# Patient Record
Sex: Female | Born: 1990
Health system: Southern US, Community
[De-identification: ages and names within clinical notes are randomized; demographics above are authoritative.]

## PROBLEM LIST (undated history)

## (undated) ENCOUNTER — Emergency Department (HOSPITAL_COMMUNITY): Admission: EM | Payer: BLUE CROSS/BLUE SHIELD | Source: Home / Self Care

## (undated) DIAGNOSIS — F32A Depression, unspecified: Secondary | ICD-10-CM

## (undated) DIAGNOSIS — B379 Candidiasis, unspecified: Secondary | ICD-10-CM

## (undated) DIAGNOSIS — T7840XA Allergy, unspecified, initial encounter: Secondary | ICD-10-CM

## (undated) DIAGNOSIS — F329 Major depressive disorder, single episode, unspecified: Secondary | ICD-10-CM

## (undated) DIAGNOSIS — I341 Nonrheumatic mitral (valve) prolapse: Secondary | ICD-10-CM

## (undated) DIAGNOSIS — S42309A Unspecified fracture of shaft of humerus, unspecified arm, initial encounter for closed fracture: Secondary | ICD-10-CM

## (undated) DIAGNOSIS — F419 Anxiety disorder, unspecified: Secondary | ICD-10-CM

## (undated) HISTORY — PX: TONSILLECTOMY: SUR1361

## (undated) HISTORY — DX: Allergy, unspecified, initial encounter: T78.40XA

## (undated) HISTORY — DX: Major depressive disorder, single episode, unspecified: F32.9

## (undated) HISTORY — DX: Unspecified fracture of shaft of humerus, unspecified arm, initial encounter for closed fracture: S42.309A

## (undated) HISTORY — DX: Depression, unspecified: F32.A

## (undated) HISTORY — DX: Anxiety disorder, unspecified: F41.9

## (undated) HISTORY — DX: Candidiasis, unspecified: B37.9

## (undated) HISTORY — DX: Nonrheumatic mitral (valve) prolapse: I34.1

## (undated) HISTORY — PX: APPENDECTOMY: SHX54

## (undated) HISTORY — PX: OTHER SURGICAL HISTORY: SHX169

---
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
## 2011-08-22 ENCOUNTER — Ambulatory Visit (INDEPENDENT_AMBULATORY_CARE_PROVIDER_SITE_OTHER): Payer: BC Managed Care – PPO

## 2011-08-22 DIAGNOSIS — H6500 Acute serous otitis media, unspecified ear: Secondary | ICD-10-CM

## 2011-08-22 DIAGNOSIS — J069 Acute upper respiratory infection, unspecified: Secondary | ICD-10-CM

## 2012-02-16 ENCOUNTER — Ambulatory Visit (INDEPENDENT_AMBULATORY_CARE_PROVIDER_SITE_OTHER): Payer: BC Managed Care – PPO | Admitting: Emergency Medicine

## 2012-02-16 VITALS — BP 116/78 | HR 81 | Temp 97.8°F | Resp 16 | Ht 66.58 in | Wt 133.6 lb

## 2012-02-16 DIAGNOSIS — Z2089 Contact with and (suspected) exposure to other communicable diseases: Secondary | ICD-10-CM

## 2012-02-16 DIAGNOSIS — K121 Other forms of stomatitis: Secondary | ICD-10-CM

## 2012-02-16 DIAGNOSIS — R509 Fever, unspecified: Secondary | ICD-10-CM

## 2012-02-16 DIAGNOSIS — K1370 Unspecified lesions of oral mucosa: Secondary | ICD-10-CM

## 2012-02-16 DIAGNOSIS — Z202 Contact with and (suspected) exposure to infections with a predominantly sexual mode of transmission: Secondary | ICD-10-CM

## 2012-02-16 LAB — POCT CBC
Granulocyte percent: 67.7 %G (ref 37–80)
MCV: 96.6 fL (ref 80–97)
MID (cbc): 0.5 (ref 0–0.9)
MPV: 8.8 fL (ref 0–99.8)
POC Granulocyte: 5.4 (ref 2–6.9)
Platelet Count, POC: 309 10*3/uL (ref 142–424)
RBC: 4.25 M/uL (ref 4.04–5.48)

## 2012-02-16 NOTE — Progress Notes (Signed)
  Subjective:    Patient ID: Dana Perry, female    DOB: [birth year], 22 y.o.   MRN: [id number]  Mouth Lesions  The current episode started 3 to 5 days ago. The problem occurs rarely. The problem has been unchanged. The problem is mild. Nothing relieves the symptoms. Nothing aggravates the symptoms. Associated symptoms include a fever and mouth sores. Pertinent negatives include no decreased vision, no double vision, no eye itching, no photophobia, no abdominal pain, no congestion, no ear discharge, no ear pain, no headaches, no hearing loss, no rhinorrhea, no sore throat, no stridor, no swollen glands, no eye discharge, no eye pain and no eye redness.  Exposure to STD  The patient's pertinent negatives include no discharge, dyspareunia, dysuria, genital itching, genital lesions, genital rash or pelvic pain. This is a new problem. The current episode started in the past 7 days. The problem has been unchanged. Associate symptoms include a fever. Pertinent negatives include no abdominal pain, anorexia, diaphoresis, genital odor, rectal pain, sore throat or urinary frequency. She has tried nothing for the symptoms. Risk factors include multiple sexual partners.      Review of Systems  Constitutional: Positive for fever. Negative for diaphoresis.  HENT: Positive for mouth sores. Negative for hearing loss, ear pain, congestion, sore throat, rhinorrhea and ear discharge.   Eyes: Negative for double vision, photophobia, pain, discharge, redness and itching.  Respiratory: Negative for stridor.   Gastrointestinal: Negative for abdominal pain, rectal pain and anorexia.  Genitourinary: Negative for dysuria, frequency, pelvic pain and dyspareunia.  Neurological: Negative for headaches.       Objective:   Physical Exam  Constitutional: She is oriented to person, place, and time. She appears well-developed and well-nourished.  HENT:  Head: Normocephalic and atraumatic. No trismus in the jaw.  Right  Ear: External ear normal.  Left Ear: External ear normal.  Nose: Nose normal.  Mouth/Throat: She does not have dentures. Oral lesions present. Normal dentition. No dental abscesses, uvula swelling, lacerations or dental caries. No oropharyngeal exudate, posterior oropharyngeal edema, posterior oropharyngeal erythema or tonsillar abscesses.  Eyes: Conjunctivae are normal. Pupils are equal, round, and reactive to light.  Neck: Normal range of motion. Neck supple.  Cardiovascular: Normal rate and regular rhythm.   Pulmonary/Chest: Effort normal and breath sounds normal.  Abdominal: Soft. Bowel sounds are normal. She exhibits no distension and no mass. There is no tenderness. There is no rebound and no guarding.  Musculoskeletal: Normal range of motion.  Neurological: She is alert and oriented to person, place, and time.  Skin: Skin is warm and dry.  Psychiatric: She has a normal mood and affect. Her behavior is normal. Judgment and thought content normal.          Assessment & Plan:  Just spent a significant period of time in [place] and engaged in oral sex just prior to return.  Now has oral ulceration and has had a fever, not documented, as well over last week.  Concerned that she may have HIV  To further treat following labs.

## 2012-02-16 NOTE — Patient Instructions (Signed)
Safer Sex Your caregiver wants you to have this information about the infections that can be transmitted from sexual contact and how to prevent them. The idea behind safer sex is that you can be sexually active, and at the same time reduce the risk of giving or getting a sexually transmitted disease (STD). Every person should be aware of how to prevent him or herself and his or her sex partner from getting an STD. CAUSES OF STDS STDs are transmitted by sharing body fluids, which contain viruses and bacteria. The following fluids all transmit infections during sexual intercourse and sex acts:  Semen.   Saliva.   Urine.   Blood.   Vaginal mucus.  Examples of STDs include:  Chlamydia.   Gonorrhea.   Genital herpes.   Hepatitis B.   Human immunodeficiency virus or acquired immunodeficiency syndrome (HIV or AIDS).   Syphilis.   Trichomonas.   Pubic lice.   Human papillomavirus (HPV), which may include:   Genital warts.   Cervical dysplasia.   Cervical cancer (can develop with certain types of HPV).  SYMPTOMS  Sexual diseases often cause few or no symptoms until they are advanced, so a person can be infected and spread the infection without knowing it. Some STDs respond to treatment very well. Others, like HIV and herpes, cannot be cured, but are treated to reduce their effects. Specific symptoms include:  Abnormal vaginal discharge.   Irritation or itching in and around the vagina, and in the pubic hair.   Pain during sexual intercourse.   Bleeding during sexual intercourse.   Pelvic or abdominal pain.   Fever.   Growths in and around the vagina.   An ulcer in or around the vagina.   Swollen glands in the groin area.  DIAGNOSIS   Blood tests.   Pap test.   Culture test of abnormal vaginal discharge.   A test that applies a solution and examines the cervix with a lighted magnifying scope (colposcopy).   A test that examines the pelvis with a lighted  tube, through a small incision (laparoscopy).  TREATMENT  The treatment will depend on the cause of the STD.  Antibiotic treatment by injection, oral, creams, or suppositories in the vagina.   Over-the-counter medicated shampoo, to get rid of pubic lice.   Removing or treating growths with medicine, freezing, burning (electrocautery), or surgery.   Surgery treatment for HPV of the cervix.   Supportive medicines for herpes, HIV, AIDS, and hepatitis.  Being careful cannot eliminate all risk of infection, but sex can be made much safer. Safe sexual practices include body massage and gentle touching. Masturbation is safe, as long as body fluids do not contact skin that has sores or cuts. Dry kissing and oral sex on a man wearing a latex condom or on a woman wearing a female condom is also safe. Slightly less safe is intercourse while the man wears a latex condom or wet kissing. It is also safer to have one sex partner that you know is not having sex with anyone else. LENGTH OF ILLNESS An STD might be treated and cured in a week, sometimes a month, or more. And it can linger with symptoms for many years. STDs can also cause damage to the female organs. This can cause chronic pain, infertility, and recurrence of the STD, especially herpes, hepatitis, HIV, and HPV. HOME CARE INSTRUCTIONS AND PREVENTION  Alcohol and recreational drugs are often the reason given for not practicing safer sex. These substances affect   your judgment. Alcohol and recreational drugs can also impair your immune system, making you more vulnerable to disease.   Do not engage in risky and dangerous sexual practices, including:   Vaginal or anal sex without a condom.   Oral sex on a man without a condom.   Oral sex on a woman without a female condom.   Using saliva to lubricate a condom.   Any other sexual contact in which body fluids or blood from one partner contact the other partner.   You should use only latex  condoms for men and water soluble lubricants. Petroleum based lubricants or oils used to lubricate a condom will weaken the condom and increase the chance that it will break.   Think very carefully before having sex with anyone who is high risk for STDs and HIV. This includes IV drug users, people with multiple sexual partners, or people who have had an STD, or a positive hepatitis or HIV blood test.   Remember that even if your partner has had only one previous partner, their previous partner might have had multiple partners. If so, you are at high risk of being exposed to an STD. You and your sex partner should be the only sex partners with each other, with no one else involved.   A vaccine is available for hepatitis B and HPV through your caregiver or the Public Health Department. Everyone should be vaccinated with these vaccines.   Avoid risky sex practices. Sex acts that can break the skin make you more likely to get an STD.  SEEK MEDICAL CARE IF:   If you think you have an STD, even if you do not have any symptoms. Contact your caregiver for evaluation and treatment, if needed.   You think or know your sex partner has acquired an STD.   You have any of the symptoms mentioned above.  Document Released: 09/29/2004 Document Revised: 08/11/2011 Document Reviewed: 07/22/2009 Exit[employer] Patient Information 2012 [employer]. 

## 2012-02-17 LAB — HEPATITIS B SURFACE ANTIGEN: Hepatitis B Surface Ag: NEGATIVE

## 2012-02-17 LAB — HSV(HERPES SIMPLEX VRS) I + II AB-IGG
HSV 1 Glycoprotein G Ab, IgG: <0.1 IV
HSV 2 Glycoprotein G Ab, IgG: 0.63 IV

## 2012-02-17 LAB — HEPATIC FUNCTION PANEL
ALT: 8 U/L (ref 0–35)
AST: 11 U/L (ref 0–37)
Alkaline Phosphatase: 34 U/L — ABNORMAL LOW (ref 39–117)
Bilirubin, Direct: 0.3 mg/dL (ref 0.0–0.3)
Indirect Bilirubin: 1.4 mg/dL — ABNORMAL HIGH (ref 0.0–0.9)
Total Protein: 7.1 g/dL (ref 6.0–8.3)

## 2012-02-17 LAB — HIV ANTIBODY (ROUTINE TESTING W REFLEX): HIV: NONREACTIVE

## 2012-03-02 ENCOUNTER — Other Ambulatory Visit: Payer: Self-pay | Admitting: Internal Medicine

## 2012-03-02 ENCOUNTER — Ambulatory Visit (INDEPENDENT_AMBULATORY_CARE_PROVIDER_SITE_OTHER): Payer: BC Managed Care – PPO | Admitting: Internal Medicine

## 2012-03-02 VITALS — BP 105/68 | HR 67 | Temp 98.0°F | Resp 16 | Ht 67.0 in | Wt 138.0 lb

## 2012-03-02 DIAGNOSIS — L709 Acne, unspecified: Secondary | ICD-10-CM

## 2012-03-02 DIAGNOSIS — R350 Frequency of micturition: Secondary | ICD-10-CM

## 2012-03-02 DIAGNOSIS — R634 Abnormal weight loss: Secondary | ICD-10-CM

## 2012-03-02 DIAGNOSIS — R5383 Other fatigue: Secondary | ICD-10-CM

## 2012-03-02 DIAGNOSIS — H538 Other visual disturbances: Secondary | ICD-10-CM

## 2012-03-02 DIAGNOSIS — Z2089 Contact with and (suspected) exposure to other communicable diseases: Secondary | ICD-10-CM

## 2012-03-02 DIAGNOSIS — R5381 Other malaise: Secondary | ICD-10-CM

## 2012-03-02 DIAGNOSIS — Z202 Contact with and (suspected) exposure to infections with a predominantly sexual mode of transmission: Secondary | ICD-10-CM

## 2012-03-02 DIAGNOSIS — L708 Other acne: Secondary | ICD-10-CM

## 2012-03-02 LAB — TSH: TSH: 1.638 u[IU]/mL (ref 0.350–4.500)

## 2012-03-02 LAB — POCT CBC
Granulocyte percent: 67.4 %G (ref 37–80)
HCT, POC: 45.8 % (ref 37.7–47.9)
Hemoglobin: 14.5 g/dL (ref 12.2–16.2)
MPV: 9.4 fL (ref 0–99.8)
POC Granulocyte: 4.6 (ref 2–6.9)
POC MID %: 5.4 %M (ref 0–12)

## 2012-03-02 LAB — POCT GLYCOSYLATED HEMOGLOBIN (HGB A1C): Hemoglobin A1C: 5

## 2012-03-02 MED ORDER — FLUTICASONE PROPIONATE 50 MCG/ACT NA SUSP: 2.0000 | Freq: Every day | NASAL | Status: DC

## 2012-03-02 MED ORDER — DOXYCYCLINE HYCLATE 50 MG PO CAPS: 50.0000 mg | ORAL_CAPSULE | Freq: Two times a day (BID) | ORAL | Status: AC

## 2012-03-03 LAB — COMPREHENSIVE METABOLIC PANEL
ALT: <8 U/L (ref 0–35)
AST: 13 U/L (ref 0–37)
CO2: 24 mEq/L (ref 19–32)
Calcium: 9.3 mg/dL (ref 8.4–10.5)
Chloride: 103 mEq/L (ref 96–112)
Sodium: 139 mEq/L (ref 135–145)
Total Bilirubin: 0.9 mg/dL (ref 0.3–1.2)
Total Protein: 6.9 g/dL (ref 6.0–8.3)

## 2012-03-03 NOTE — Progress Notes (Signed)
Present illness Complaining of a number of symptoms since her return from a semester abroad in [place] and in the summer of traveling in [place] She had OI in [place] and has been obsessing about what illnesses she could contract as a result. See Evaluation recently with negative tests, and now she is ready to repeat these.Never had vaginal intercourse. No use of illegal drugs. When she drinks  The effectiveness of her Lexapro disappears when she is symptomatic for a day or 2 She is worried about her increased acne which started during her semester in [place]. She has lost 20 pounds since she went abroad and although her appetite was less good day her she reports no fatigue night sweats fever or lymphadenopathy until after her trip to [place]. Currently she complains of fatigue, urinary frequency, occasional diarrhea, occasional blurred vision but she lost her glasses, trouble falling asleep, trouble waking early, tingling in her hands and feet off a known, stiffness in her hands when she wakes, increased thirst, and occasional feelings of shortness of breath while at rest.  She is under the care of Dr.Kaur for anxiety and depression and had been for stable.  Review of systems= essentially negative outside of her present illness  Social history= returns to [employer]. This fall   Is here with her twin sister  Exam= Vital signs normal Is anxious Mild comedonal acne on the face ENT clear except slight injection of the tonsillar tissue No a.c. Or PC nodes Chest clear Heart normal Extremities normal/rest of skin clear Neurological intact  1. Fatigue  Comprehensive metabolic panel, TSH, Epstein-Barr virus VCA antibody panel  2. Urinary frequency  POCT glycosylated hemoglobin (Hb A1C)  3. Acne  POCT CBC  4. Blurred vision  POCT glycosylated hemoglobin (Hb A1C)  5. Abnormal weight loss  POCT glycosylated hemoglobin (Hb A1C), Comprehensive metabolic panel  6. Exposure to STD   RPR, HIV antibody, Gonococcus culture   Results for orders placed in visit on 03/02/12  POCT CBC      Component Value Range   WBC 6.8  4.6 - 10.2 K/uL   Lymph, poc 1.8  0.6 - 3.4   POC LYMPH PERCENT 27.2  10 - 50 %L   MID (cbc) 0.4  0 - 0.9   POC MID % 5.4  0 - 12 %M   POC Granulocyte 4.6  2 - 6.9   Granulocyte percent 67.4  37 - 80 %G   RBC 4.48  4.04 - 5.48 M/uL   Hemoglobin 14.5  12.2 - 16.2 g/dL   HCT, POC 16.1  09.6 - 47.9 %   MCV 102.3 (*) 80 - 97 fL   MCH, POC 32.4 (*) 27 - 31.2 pg   MCHC 31.7 (*) 31.8 - 35.4 g/dL   RDW, POC 04.5     Platelet Count, POC 253  142 - 424 K/uL   MPV 9.4  0 - 99.8 fL  POCT GLYCOSYLATED HEMOGLOBIN (HGB A1C)      Component Value Range   Hemoglobin A1C 5.0    COMPREHENSIVE METABOLIC PANEL      Component Value Range   Sodium 139  135 - 145 mEq/L   Potassium 4.4  3.5 - 5.3 mEq/L   Chloride 103  96 - 112 mEq/L   CO2 24  19 - 32 mEq/L   Glucose, Bld 103 (*) 70 - 99 mg/dL   BUN 16  6 - 23 mg/dL   Creat 4.09  8.11 - 9.14  mg/dL   Total Bilirubin 0.9  0.3 - 1.2 mg/dL   Alkaline Phosphatase 34 (*) 39 - 117 U/L   AST 13  0 - 37 U/L   ALT <8  0 - 35 U/L   Total Protein 6.9  6.0 - 8.3 g/dL   Albumin 4.6  3.5 - 5.2 g/dL   Calcium 9.3  8.4 - 16.1 mg/dL  TSH      Component Value Range   TSH 1.638  0.350 - 4.500 uIU/mL  RPR      Component Value Range   RPR NON REAC  NON REAC  HIV ANTIBODY (ROUTINE TESTING)      Component Value Range   HIV NON REACTIVE  NON REACTIVE  EPSTEIN-BARR VIRUS VCA ANTIBODY PANEL      Component Value Range   EBV VCA IgG    <18.0 U/mL   EBV VCA IgM    <36.0 U/mL   EBV EA IgG    <9.0 U/mL   EBV NA IgG    <18.0 U/mL   Meds ordered this encounter  Medications  . fluticasone (FLONASE) 50 MCG/ACT nasal spray    Sig: Place 2 sprays into the nose daily.    Dispense:  16 g    Refill:  6  . doxycycline (VIBRAMYCIN) 50 MG capsule    Sig: Take 1 capsule (50 mg total) by mouth 2 (two) times daily.    Dispense:  20 capsule     Refill:  0   Will call with these results

## 2012-03-05 LAB — EPSTEIN-BARR VIRUS VCA ANTIBODY PANEL
EBV NA IgG: <3 U/mL (ref <18.0)
EBV VCA IgG: <10 U/mL (ref <18.0)
EBV VCA IgM: <10 U/mL (ref <36.0)

## 2012-03-06 ENCOUNTER — Telehealth: Payer: Self-pay

## 2012-03-06 NOTE — Telephone Encounter (Signed)
Printed out phone message due to patient only having a paper chart. °

## 2012-03-06 NOTE — Telephone Encounter (Signed)
Pt requesting copy of all immunizations documented  At our facility   [facility] phone [phone]

## 2012-03-09 ENCOUNTER — Encounter: Payer: Self-pay | Admitting: Internal Medicine

## 2012-03-09 LAB — GONOCOCCUS CULTURE: Organism ID, Bacteria: NO GROWTH

## 2012-07-04 ENCOUNTER — Ambulatory Visit (INDEPENDENT_AMBULATORY_CARE_PROVIDER_SITE_OTHER): Payer: BC Managed Care – PPO | Admitting: Obstetrics and Gynecology

## 2012-07-04 ENCOUNTER — Encounter: Payer: Self-pay | Admitting: Obstetrics and Gynecology

## 2012-07-04 VITALS — BP 110/80 | Ht 66.5 in | Wt 136.0 lb

## 2012-07-04 DIAGNOSIS — I341 Nonrheumatic mitral (valve) prolapse: Secondary | ICD-10-CM | POA: Insufficient documentation

## 2012-07-04 DIAGNOSIS — Z01419 Encounter for gynecological examination (general) (routine) without abnormal findings: Secondary | ICD-10-CM

## 2012-07-04 DIAGNOSIS — Z124 Encounter for screening for malignant neoplasm of cervix: Secondary | ICD-10-CM

## 2012-07-04 DIAGNOSIS — Z309 Encounter for contraceptive management, unspecified: Secondary | ICD-10-CM

## 2012-07-04 DIAGNOSIS — Z113 Encounter for screening for infections with a predominantly sexual mode of transmission: Secondary | ICD-10-CM

## 2012-07-04 DIAGNOSIS — B379 Candidiasis, unspecified: Secondary | ICD-10-CM | POA: Insufficient documentation

## 2012-07-04 LAB — RPR

## 2012-07-04 LAB — HEPATITIS B SURFACE ANTIGEN: Hepatitis B Surface Ag: NEGATIVE

## 2012-07-04 LAB — HEPATITIS C ANTIBODY: HCV Ab: NEGATIVE

## 2012-07-04 LAB — HIV ANTIBODY (ROUTINE TESTING W REFLEX): HIV: NONREACTIVE

## 2012-07-04 MED ORDER — DROSPIRENONE-ETHINYL ESTRADIOL 3-0.02 MG PO TABS: 1.0000 | ORAL_TABLET | Freq: Every day | ORAL | Status: DC

## 2012-07-04 NOTE — Progress Notes (Signed)
The patient reports:no complaints  Contraception:condoms  Last mammogram: not applicable  Last pap: not applicable   GC/Chlamydia cultures offered: requested HIV/RPR/HbsAg offered:  requested HSV 1 and 2 glycoprotein offered: requested  Menstrual cycle regular and monthly: Yes Menstrual flow normal: Yes  Urinary symptoms: none Normal bowel movements: No pt c/o diarrhea recently Reports abuse at home: No:   Pt states that she recently had intercourse for the first time. Wanting to get full STD panel. States that she did use condom for protection. Unable to leave urine sample.   Subjective:    Dana Perry is a 21 y.o. female, G0P0000, who presents for an annual exam.     History   Social History  . Marital Status: Single    Spouse Name: N/A    Number of Children: N/A  . Years of Education: N/A   Social History Main Topics  . Smoking status: Never Smoker   . Smokeless tobacco: Never Used  . Alcohol Use: No  . Drug Use: No  . Sexually Active: Yes    Birth Control/ Protection: Condom   Other Topics Concern  . None   Social History Narrative  . None    Menstrual cycle:   LMP: Patient's last menstrual period was 06/24/2012.           Cycle: normal  The following portions of the patient's history were reviewed and updated as appropriate: allergies, current medications, past family history, past medical history, past social history, past surgical history and problem list.  Review of Systems Pertinent items are noted in HPI. Breast:Negative for breast lump,nipple discharge or nipple retraction Gastrointestinal: Negative for abdominal pain, change in bowel habits or rectal bleeding Urinary:negative   Objective:    BP 110/80  Ht 5' 6.5" (1.689 m)  Wt 136 lb (61.689 kg)  BMI 21.62 kg/m2  LMP 06/24/2012    Weight:  Wt Readings from Last 1 Encounters:  07/04/12 136 lb (61.689 kg)          BMI: Body mass index is 21.62 kg/(m^2).  General Appearance: Alert,  appropriate appearance for age. No acute distress HEENT: Grossly normal Neck / Thyroid: Supple, no masses, nodes or enlargement Lungs: clear to auscultation bilaterally Back: No CVA tenderness Breast Exam: No masses or nodes.No dimpling, nipple retraction or discharge. Cardiovascular: Regular rate and rhythm. S1, S2, no murmur Gastrointestinal: Soft, non-tender, no masses or organomegaly Pelvic Exam: Vulva and vagina appear normal. Bimanual exam reveals normal uterus and adnexa. Rectovaginal: not indicated Lymphatic Exam: Non-palpable nodes in neck, clavicular, axillary, or inguinal regions Skin: no rash or abnormalities Neurologic: Normal gait and speech, no tremor  Psychiatric: Alert and oriented, appropriate affect.    Assessment:    Normal gyn exam    Plan:    pap smear return annually or prn STD screening: done Contraception: desires BCP: Yaz  Combined oral contraceptives was reviewed with the patient      With expected benefits of: cycle control, reduction in menstrual flow and dysmenorrhea, improvement of PMS and reduction of ovarian cysts and ovarian cancer. Risks of DVT/PE discussed.  Nuvaring was also reviewed With added benefit of monthly use as opposed to daily use was also reviewed.  Tobacco use: none, withouthistory of DVT/PE. Pertinent medical history:none  Nexplanon was reviewed with the patient  With expected benefits of: 3 year duration, high reliability at 99.9%, lack of estrogen and ease of insertion. Risks of DUB and possible difficult removal discussed   Follow-up 4 months  Silverio Lay MD

## 2012-07-04 NOTE — Progress Notes (Signed)
Written OCP  instructions  Reviewed and given to pt. Questions answered. Pt verbalizes comprehension.

## 2012-07-05 LAB — HSV 2 ANTIBODY, IGG: HSV 2 Glycoprotein G Ab, IgG: 0.56 IV

## 2012-07-06 LAB — PAP IG, CT-NG, RFX HPV ASCU

## 2012-07-12 ENCOUNTER — Ambulatory Visit (INDEPENDENT_AMBULATORY_CARE_PROVIDER_SITE_OTHER): Payer: BC Managed Care – PPO | Admitting: Family Medicine

## 2012-07-12 VITALS — BP 122/78 | HR 72 | Temp 98.2°F | Resp 18 | Ht 66.0 in | Wt 140.0 lb

## 2012-07-12 DIAGNOSIS — J3489 Other specified disorders of nose and nasal sinuses: Secondary | ICD-10-CM

## 2012-07-12 DIAGNOSIS — R5381 Other malaise: Secondary | ICD-10-CM

## 2012-07-12 DIAGNOSIS — R5383 Other fatigue: Secondary | ICD-10-CM

## 2012-07-12 DIAGNOSIS — M549 Dorsalgia, unspecified: Secondary | ICD-10-CM

## 2012-07-12 LAB — POCT CBC
Granulocyte percent: 39.2 %G (ref 37–80)
HCT, POC: 38.9 % (ref 37.7–47.9)
Hemoglobin: 12.1 g/dL — AB (ref 12.2–16.2)
MCV: 101.3 fL — AB (ref 80–97)
POC Granulocyte: 2 (ref 2–6.9)
POC LYMPH PERCENT: 51 %L — AB (ref 10–50)
RBC: 3.84 M/uL — AB (ref 4.04–5.48)

## 2012-07-12 LAB — POCT UA - MICROSCOPIC ONLY
Bacteria, U Microscopic: NEGATIVE
Mucus, UA: NEGATIVE
RBC, urine, microscopic: NEGATIVE
Yeast, UA: NEGATIVE

## 2012-07-12 LAB — POCT URINALYSIS DIPSTICK
Bilirubin, UA: NEGATIVE
Glucose, UA: NEGATIVE
Ketones, UA: NEGATIVE
Nitrite, UA: NEGATIVE
pH, UA: 7.5

## 2012-07-12 LAB — GLUCOSE, POCT (MANUAL RESULT ENTRY): POC Glucose: 102 mg/dl — AB (ref 70–99)

## 2012-07-12 MED ORDER — CEFDINIR 300 MG PO CAPS: 300.0000 mg | ORAL_CAPSULE | Freq: Two times a day (BID) | ORAL | Status: DC

## 2012-07-12 NOTE — Progress Notes (Signed)
[facility] [address] [place] [address] [phone]  Date:  07/12/2012   Name:  Dana Perry   DOB:  1991-08-10   MRN:  [id number]  PCP:  Pcp Not In System    Chief Complaint: Generalized Body Aches, Abdominal Pain, Nasal Congestion and Headache   History of Present Illness:  Dana Perry is a 21 y.o. very pleasant female patient who presents with the following:  She is here today with illness.  She noted a lot of pressure in her head that may be sinus pressure.  She also feels achy and her back hurts.  She has been ill for about a week, she is not sure if she had has a fever.    Her illness started last week when she was out of town last weekend with some friends.  She had a HA for about 3 days which did respond to OTC medication.  Returned to [place] on Monday and felt a bit better, but then her fatigue and malaise returned.    She also has noted some stomach cramps and diarrhea.  This has not been severe, but has been present.  Stomach cramps have come and on, she has also noted some GERD.  These are now better.  She is not having persistent abdominal pain.    She has noted diarrhea/ constipation off and on.  She has had some nausea but no vomiting. She is able to eat but does not have a great appetite.    She also has muscle aches/ back aches.  No cough, no ST.  She had a negative EBV titer over the summer    Patient Active Problem List  Diagnosis  . Yeast infection  . Mitral valve prolapse    Past Medical History  Diagnosis Date  . Yeast infection   . Mitral valve prolapse   . Anxiety   . Allergy     Past Surgical History  Procedure Date  . Tonsillectomy   . Appendectomy     History  Substance Use Topics  . Smoking status: Never Smoker   . Smokeless tobacco: Never Used  . Alcohol Use: Yes    Family History  Problem Relation Age of Onset  . Mitral valve prolapse Maternal Grandmother   . Diabetes Father   .  Diabetes Mother     No Known Allergies  Medication list has been reviewed and updated.  Current Outpatient Prescriptions on File Prior to Visit  Medication Sig Dispense Refill  . escitalopram (LEXAPRO) 20 MG tablet Take 10 mg by mouth daily.      . drospirenone-ethinyl estradiol (YAZ) 3-0.02 MG tablet Take 1 tablet by mouth daily.  1 Package  11  . fluticasone (FLONASE) 50 MCG/ACT nasal spray Place 2 sprays into the nose daily.  16 g  6    Review of Systems:  As per HPI- otherwise negative. She drinks alcohol about once a week- she does not drink daily.     Physical Examination: Filed Vitals:   07/12/12 1502  BP: 122/78  Pulse: 72  Temp: 98.2 F (36.8 C)  Resp: 18   Filed Vitals:   07/12/12 1502  Height: 5\' 6"  (1.676 m)  Weight: 140 lb (63.504 kg)   Body mass index is 22.60 kg/(m^2). Ideal Body Weight: Weight in (lb) to have BMI = 25: 154.6   GEN: WDWN, NAD, Non-toxic, A & O x 3, looks well HEENT: Atraumatic, Normocephalic. Neck supple.  No masses, No LAD.  Bilateral TM wnl, oropharynx normal.  PEERL,EOMI.   Ears and Nose: No external deformity. CV: RRR, No M/G/R. No JVD. No thrill. No extra heart sounds. PULM: CTA B, no wheezes, crackles, rhonchi. No retractions. No resp. distress. No accessory muscle use. ABD: S, ND, +BS. No rebound. No HSM.  She has slight generalized tenderness over her entire abdomen, of note she is s/p appendectomy EXTR: No c/c/e NEURO Normal gait.  PSYCH: Normally interactive. Conversant. Not depressed or anxious appearing.  Calm demeanor.   Results for orders placed in visit on 07/12/12  POCT CBC      Component Value Range   WBC 5.2  4.6 - 10.2 K/uL   Lymph, poc 2.7  0.6 - 3.4   POC LYMPH PERCENT 51.0 (*) 10 - 50 %L   MID (cbc) 0.5  0 - 0.9   POC MID % 9.8  0 - 12 %M   POC Granulocyte 2.0  2 - 6.9   Granulocyte percent 39.2  37 - 80 %G   RBC 3.84 (*) 4.04 - 5.48 M/uL   Hemoglobin 12.1 (*) 12.2 - 16.2 g/dL   HCT, POC 14.7  82.9 - 47.9  %   MCV 101.3 (*) 80 - 97 fL   MCH, POC 31.5 (*) 27 - 31.2 pg   MCHC 31.1 (*) 31.8 - 35.4 g/dL   RDW, POC 56.2     Platelet Count, POC 142  142 - 424 K/uL   MPV 9.4  0 - 99.8 fL  GLUCOSE, POCT (MANUAL RESULT ENTRY)      Component Value Range   POC Glucose 102 (*) 70 - 99 mg/dl  POCT UA - MICROSCOPIC ONLY      Component Value Range   WBC, Ur, HPF, POC 0-2     RBC, urine, microscopic neg     Bacteria, U Microscopic neg     Mucus, UA neg     Epithelial cells, urine per micros 0-1     Crystals, Ur, HPF, POC neg     Casts, Ur, LPF, POC neg     Yeast, UA neg    POCT URINALYSIS DIPSTICK      Component Value Range   Color, UA yellow     Clarity, UA clear     Glucose, UA neg     Bilirubin, UA neg     Ketones, UA neg     Spec Grav, UA 1.015     Blood, UA trace     pH, UA 7.5     Protein, UA neg     Urobilinogen, UA 1.0     Nitrite, UA neg     Leukocytes, UA Negative    POCT URINE PREGNANCY      Component Value Range   Preg Test, Ur Negative     Assessment and Plan: 1. Back ache  POCT UA - Microscopic Only, POCT urinalysis dipstick  2. Malaise  POCT CBC, POCT glucose (manual entry), POCT urine pregnancy  3. Sinus pain  cefdinir (OMNICEF) 300 MG capsule   Dana Perry is here with non- specific symptoms of illness.  Her exam, VS and labs are reassuring.  Encouraged her to add a B vitamin to her daily routine as her MCV is elevated.  Will treat her with omnicef for sinus pain and pressure.  Otherwise asked her to be sure and let me know if she is not feeling better in the next few days.  In that case will  check a mono titer and auto- immune labs.  Let me know sooner if worse or if symptoms change.   Abbe Amsterdam, MD

## 2012-07-12 NOTE — Patient Instructions (Addendum)
Please be sure to let me know if you are not better in the next few days- Sooner if worse.

## 2012-07-13 ENCOUNTER — Telehealth: Payer: Self-pay

## 2012-07-13 NOTE — Telephone Encounter (Signed)
Spoke with patient and she states has been taking IBU for her pain but it is not helping or doing anything for her pain. It is all over. Was seen yesterday by Dr. Patsy Lager. Wants something stronger for pain. Please advise

## 2012-07-13 NOTE — Telephone Encounter (Signed)
Left message to return call. Need details on what requesting.

## 2012-07-13 NOTE — Telephone Encounter (Signed)
Patient is wanting to know if she could have something stronger called in to [facility] on [address].   Pt [facility]#: [phone]

## 2012-07-14 NOTE — Telephone Encounter (Signed)
What does she need the pain medication for? Her back? Pleas get more information.

## 2012-07-15 NOTE — Telephone Encounter (Signed)
Left message to come back for recheck.

## 2012-07-15 NOTE — Telephone Encounter (Signed)
Pt states that the pain is more in her lower back

## 2012-07-15 NOTE — Telephone Encounter (Signed)
If pt is using Motrin 800-mg tid for her pain and not getting relief I think she should recheck.

## 2012-09-19 ENCOUNTER — Ambulatory Visit: Payer: BC Managed Care – PPO | Admitting: Obstetrics and Gynecology

## 2012-09-19 ENCOUNTER — Encounter: Payer: Self-pay | Admitting: Obstetrics and Gynecology

## 2012-09-19 VITALS — BP 112/64 | Ht 66.5 in | Wt 139.0 lb

## 2012-09-19 DIAGNOSIS — Z309 Encounter for contraceptive management, unspecified: Secondary | ICD-10-CM

## 2012-09-19 DIAGNOSIS — N898 Other specified noninflammatory disorders of vagina: Secondary | ICD-10-CM

## 2012-09-19 DIAGNOSIS — N94810 Vulvar vestibulitis: Secondary | ICD-10-CM

## 2012-09-19 LAB — POCT WET PREP (WET MOUNT)
Bacteria Wet Prep HPF POC: NEGATIVE
Trichomonas Wet Prep HPF POC: NEGATIVE
pH: 4

## 2012-09-19 MED ORDER — CLOBETASOL PROPIONATE 0.05 % EX OINT: TOPICAL_OINTMENT | Freq: Two times a day (BID) | CUTANEOUS | Status: DC

## 2012-09-19 NOTE — Progress Notes (Signed)
Subjective:    Dana Perry is a 22 y.o. female, [id number], who presents for pain with intercourse ?Cyst  Pt stopped taking Yaz on 09/06/2012  because of emotional issues. Had intercourse for the first time in June. Was having pain during intercourse which did stop. States that she began to have pain again in November. Pt states that she did have a couple days in November where she had vaginal pain without intercourse and slight discharge. States that she feels pain during first penetration and the pain stops, but pain continues again after intercourse is finished.    The following portions of the patient's history were reviewed and updated as appropriate: allergies, current medications, past family history.  Review of Systems Pertinent items are noted in HPI. Breast:Negative for breast lump,nipple discharge or nipple retraction Gastrointestinal: Negative for abdominal pain, change in bowel habits or rectal bleeding Urinary:negative   Objective:    Ht 5' 6.5" (1.689 m)  Wt 139 lb (63.05 kg)  BMI 22.10 kg/m2  LMP 09/08/2012    Weight:  Wt Readings from Last 1 Encounters:  09/19/12 139 lb (63.05 kg)          BMI: Body mass index is 22.10 kg/(m^2).  General Appearance: Alert, appropriate appearance for age. No acute distress GYN exam: vulva is normal, vagina is normal, Cervix is normal, no discharge                     Mild erythema at the vestibule at 4 o'clock with pain  Wet prep: negative   Assessment:   Vestibulitis  Contraceptive management    Plan:    return annually or prn Wet Prep Temovate cream x 2 weeks to irritated vaginal area reccommended Non-Lubricated Condoms with Olive oil / K-Y Intrigue  as Lubrication reccommended  Depo-Provera was reviewed with the patient With expected benefits of: every 12 weeks utilization, high reliability at 99%, lack of estrogen and possible amenorrhea. Risks of weight gain, reversible bone loss and possible longer time to reverse ( 6  to 24 months) discussed  Nexplanon was reviewed with the patient With expected benefits of: 3 year duration, high reliability at 99.9%, lack of estrogen and ease of insertion. Risks of DUB and possible difficult removal discussed   Silverio Lay MD

## 2012-11-15 ENCOUNTER — Encounter: Payer: BC Managed Care – PPO | Admitting: Obstetrics and Gynecology

## 2013-03-27 ENCOUNTER — Ambulatory Visit: Payer: BC Managed Care – PPO

## 2013-06-02 ENCOUNTER — Ambulatory Visit: Payer: BC Managed Care – PPO | Admitting: Physician Assistant

## 2013-06-02 VITALS — BP 122/66 | HR 77 | Temp 98.1°F | Resp 16 | Ht 66.25 in | Wt 136.0 lb

## 2013-06-02 DIAGNOSIS — Z202 Contact with and (suspected) exposure to infections with a predominantly sexual mode of transmission: Secondary | ICD-10-CM

## 2013-06-02 DIAGNOSIS — J069 Acute upper respiratory infection, unspecified: Secondary | ICD-10-CM

## 2013-06-02 DIAGNOSIS — Z2089 Contact with and (suspected) exposure to other communicable diseases: Secondary | ICD-10-CM

## 2013-06-02 NOTE — Progress Notes (Signed)
   [address] [place] [address]   Phone [phone]  Subjective:    Patient ID: Dana Perry, female    DOB: 1990/10/29, 22 y.o.   MRN: [id number]  HPI Pt presents to clinic with 2 concerns 1- cold symptoms for the last 4 days - seem to be getting slightly better - started with a ST that has now resolved and cough that has improved - now she mainly has remiaming congestion with yellow rhinorrhea.  She has been using intermittent cold preps. 2- would like to be tested for HSV and other STDs while she is here.  She has had fever blisters in the past.  Her last partner (none for 3 wks) had been exposed to HSV and she is concerned.  He never had any symptoms and he told the patient that he had tested neg for STDs but she never specifically asked about HSV.  She is currently having no symptoms.  Sick contacts - multiple OTC meds - cold preps  Review of Systems  Constitutional: Negative for fever and chills.  HENT: Positive for congestion, sore throat (resolved currently), rhinorrhea (yellow) and postnasal drip.   Respiratory: Positive for cough (dry cough).   Allergic/Immunologic: Positive for environmental allergies (this feels different than her normal allergies).       Objective:   Physical Exam  Vitals reviewed. Constitutional: She is oriented to person, place, and time. She appears well-developed and well-nourished.  HENT:  Head: Normocephalic and atraumatic.  Right Ear: Hearing, tympanic membrane, external ear and ear canal normal.  Left Ear: Hearing, tympanic membrane, external ear and ear canal normal.  Nose: Nose normal.  Mouth/Throat: Uvula is midline, oropharynx is clear and moist and mucous membranes are normal.  Eyes: Conjunctivae are normal.  Neck: Normal range of motion.  Cardiovascular: Normal rate, regular rhythm and normal heart sounds.   No murmur heard. Pulmonary/Chest: Effort normal and breath sounds normal.  Lymphadenopathy:    She has no cervical  adenopathy.  Neurological: She is alert and oriented to person, place, and time.  Skin: Skin is warm and dry.  Psychiatric: She has a normal mood and affect. Her behavior is normal. Judgment and thought content normal.      Assessment & Plan:  Exposure to STD - Plan: HSV(herpes simplex vrs) 1+2 ab-IgG, RPR, HIV antibody, Hepatitis C antibody, Hepatitis B surface antigen, Hepatitis B surface antibody, GC/Chlamydia Probe Amp  Acute upper respiratory infections of unspecified site - can get Mucinex and use Tylenol/motrin for myalgias and feeling bad.  Christyne Mccain PA-C 06/02/2013 4:36 PM

## 2013-06-03 LAB — RPR

## 2013-06-04 LAB — HSV(HERPES SIMPLEX VRS) I + II AB-IGG: HSV 2 Glycoprotein G Ab, IgG: 0.24 IV

## 2013-06-05 ENCOUNTER — Other Ambulatory Visit: Payer: Self-pay | Admitting: Physician Assistant

## 2013-06-06 NOTE — Progress Notes (Signed)
Advised pt of results she will be calling her insurance company to find out if the Hep B is covered and will RTC for immunization.

## 2014-01-16 ENCOUNTER — Ambulatory Visit: Payer: BC Managed Care – PPO | Admitting: Family Medicine

## 2014-01-16 ENCOUNTER — Ambulatory Visit: Payer: BC Managed Care – PPO

## 2014-01-16 VITALS — BP 120/72 | HR 76 | Temp 98.2°F | Resp 16 | Ht 66.0 in | Wt 136.0 lb

## 2014-01-16 DIAGNOSIS — F411 Generalized anxiety disorder: Secondary | ICD-10-CM

## 2014-01-16 DIAGNOSIS — Z7729 Contact with and (suspected ) exposure to other hazardous substances: Secondary | ICD-10-CM

## 2014-01-16 DIAGNOSIS — Z77098 Contact with and (suspected) exposure to other hazardous, chiefly nonmedicinal, chemicals: Secondary | ICD-10-CM

## 2014-01-16 DIAGNOSIS — R300 Dysuria: Secondary | ICD-10-CM

## 2014-01-16 LAB — POCT UA - MICROSCOPIC ONLY
CASTS, UR, LPF, POC: NEGATIVE
CRYSTALS, UR, HPF, POC: NEGATIVE
Mucus, UA: POSITIVE
YEAST UA: NEGATIVE

## 2014-01-16 LAB — POCT URINALYSIS DIPSTICK
Bilirubin, UA: NEGATIVE
GLUCOSE UA: NEGATIVE
Ketones, UA: NEGATIVE
Leukocytes, UA: NEGATIVE
NITRITE UA: NEGATIVE
Spec Grav, UA: 1.025
UROBILINOGEN UA: 1
pH, UA: 7

## 2014-01-16 IMAGING — CR DG CHEST 2V
2 series · 2 of 2 positions shown · non-contrast
Comparison: None.

CLINICAL DATA: Shortness of Breath

EXAM:
CHEST  2 VIEW

[PA]
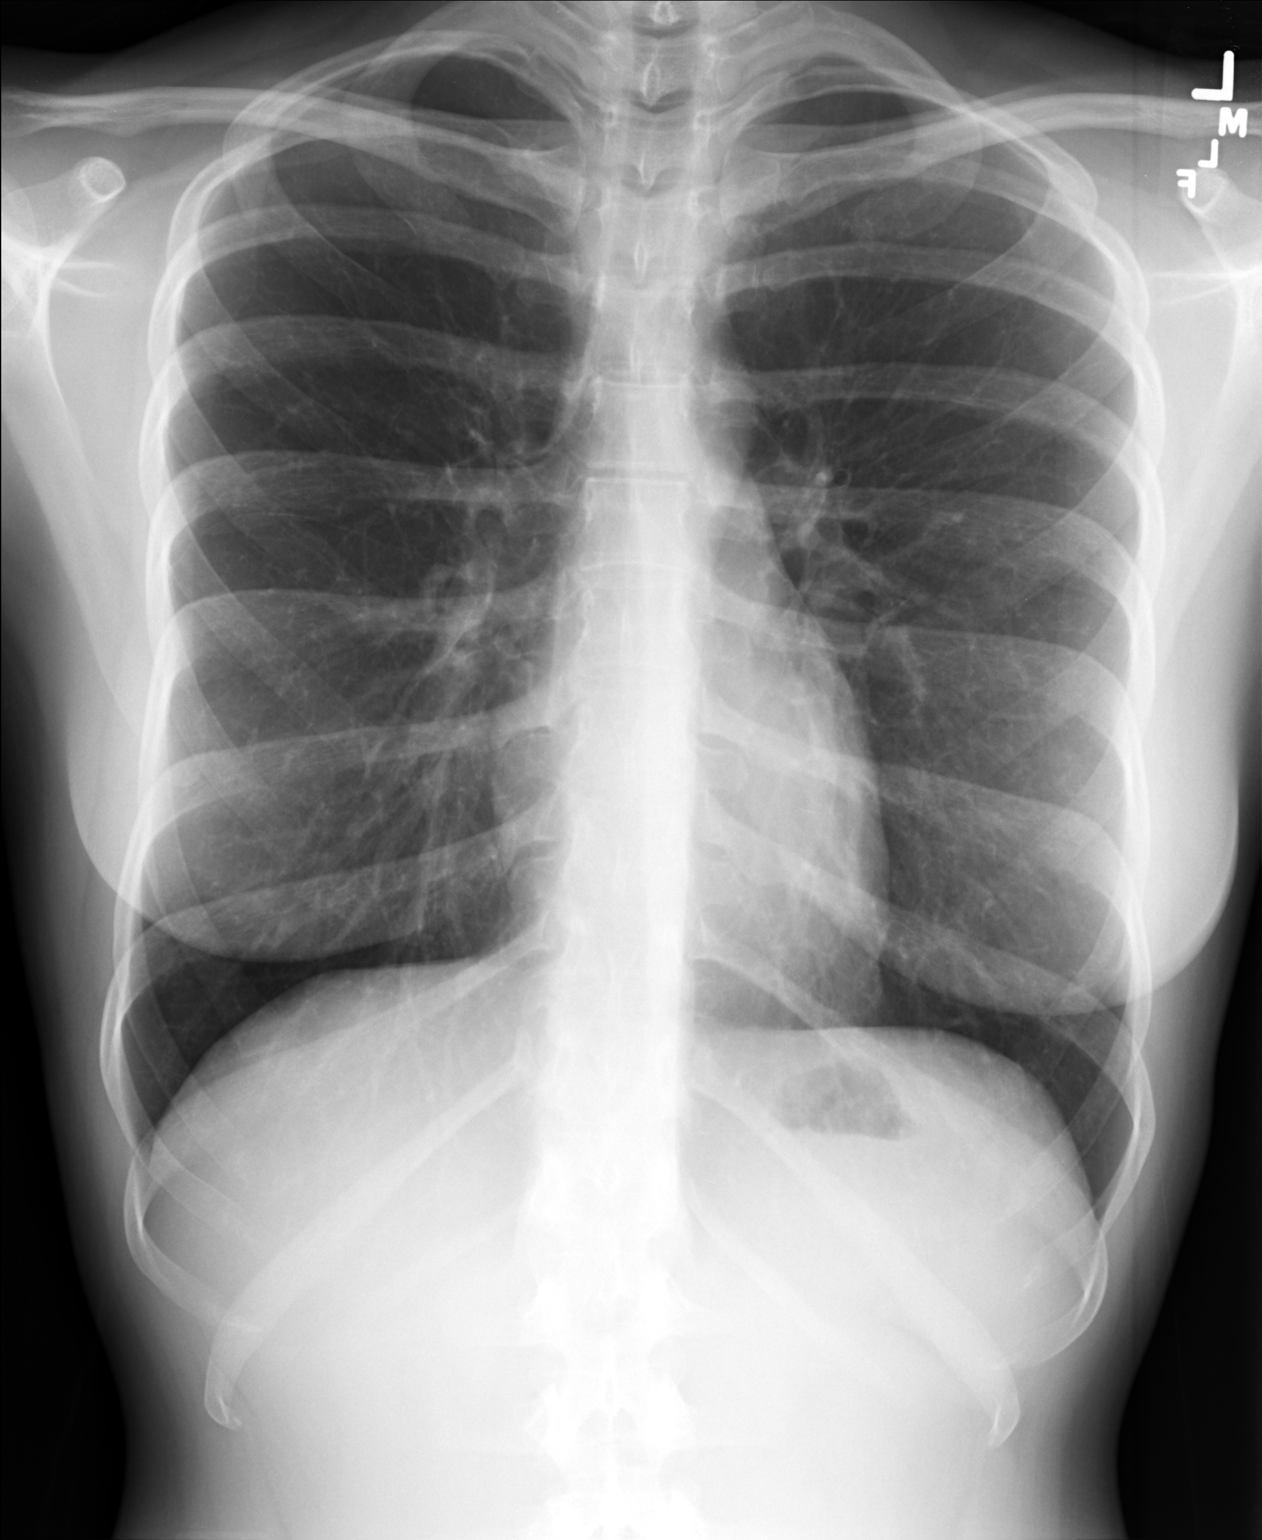

[lateral]
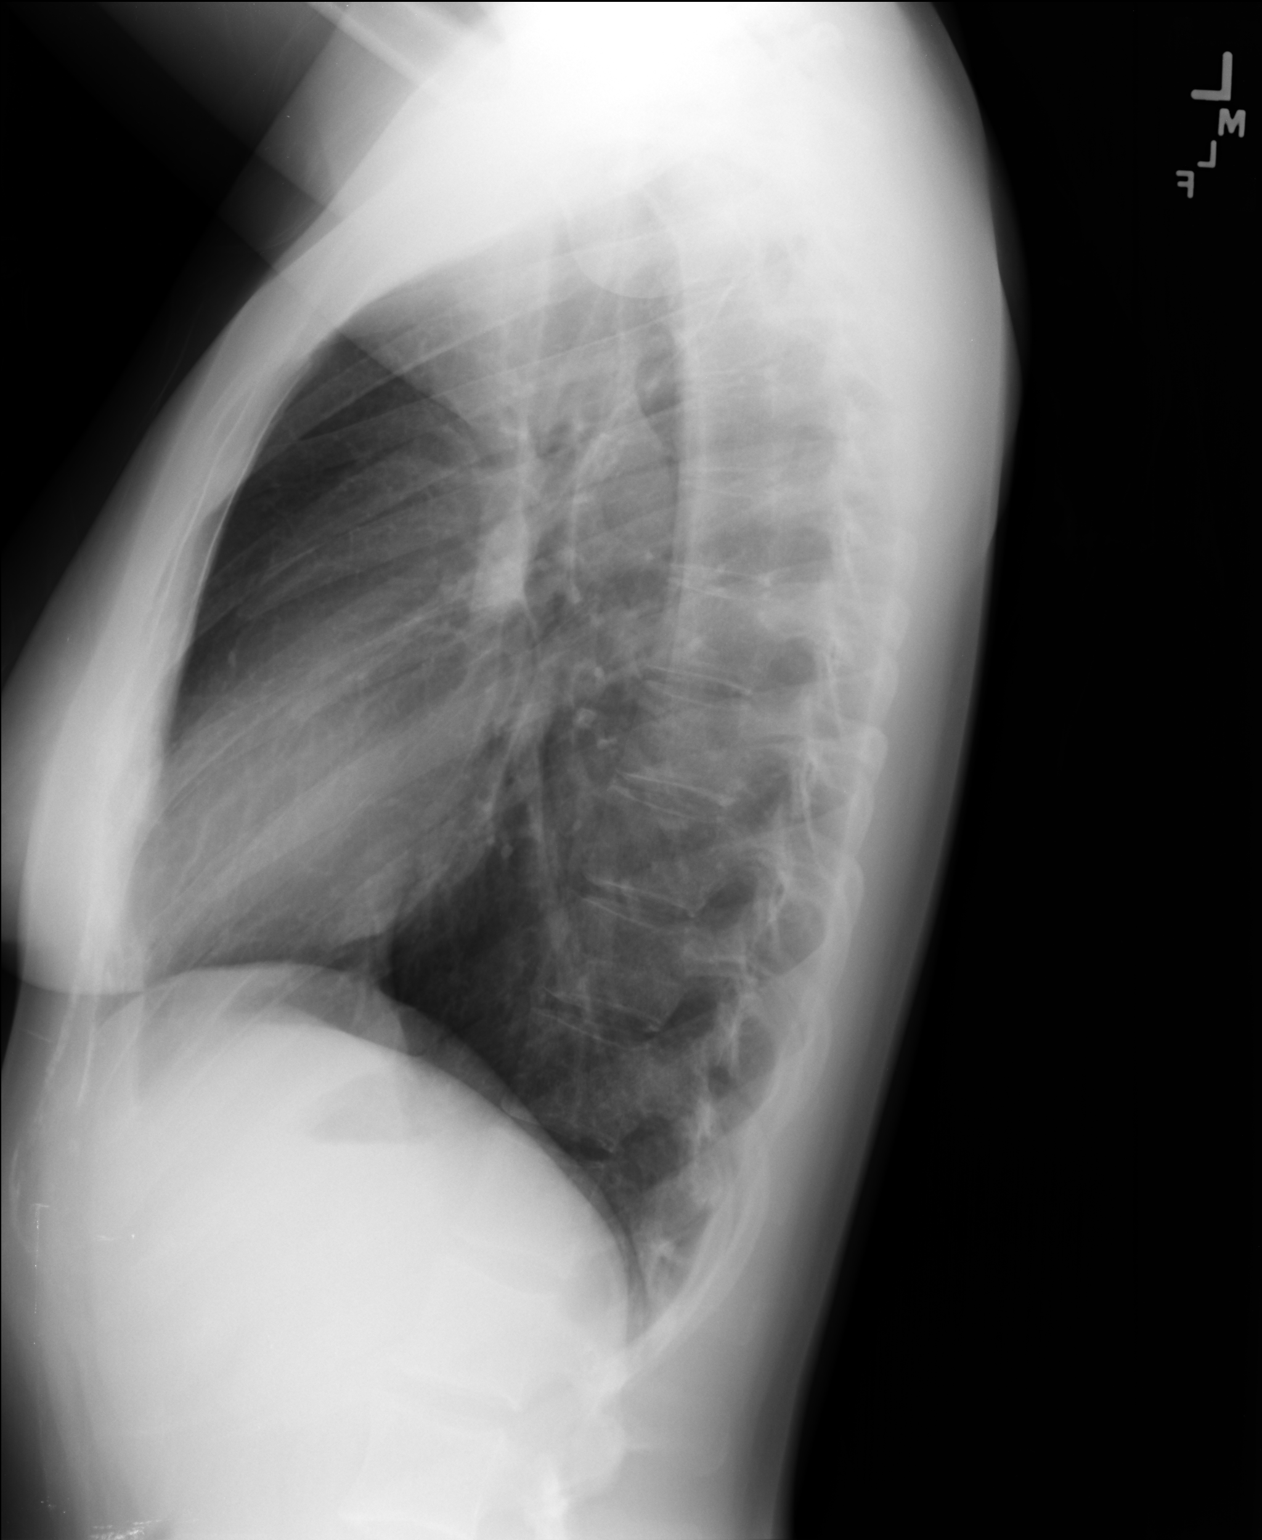

[2 of 2 positions shown; findings below may reference images not displayed]

FINDINGS: Cardiomediastinal silhouette is unremarkable. No acute infiltrate or
pleural effusion. No pulmonary edema. Bony thorax is unremarkable.
IMPRESSION: No active cardiopulmonary disease.

## 2014-01-16 MED ORDER — CEPHALEXIN 500 MG PO CAPS: 500.0000 mg | ORAL_CAPSULE | Freq: Four times a day (QID) | ORAL | Status: DC

## 2014-01-16 MED ORDER — CEPHALEXIN 500 MG PO CAPS: 500.0000 mg | ORAL_CAPSULE | Freq: Two times a day (BID) | ORAL | Status: DC

## 2014-01-16 MED ORDER — ALPRAZOLAM 0.25 MG PO TABS: 0.2500 mg | ORAL_TABLET | Freq: Two times a day (BID) | ORAL | Status: DC | PRN

## 2014-01-16 NOTE — Patient Instructions (Addendum)
Your lungs and vitals look good. I think you are going to be ok from your recent inhalation.  Take it easy over the next few days, and let me know if you do not feel better soon.    You can use the xanax sparingly as needed for anxiety.  Remember it can be habit forming and will make you sleepy, so use it with caution  Take the keflex twice a day for one week for UTI

## 2014-01-16 NOTE — Progress Notes (Signed)
[facility] [address] [place] [address] [phone]  Date:  01/16/2014   Name:  Dana Perry   DOB:  [birth year]   MRN:  [id number]  PCP:  Pcp Not In System    Chief Complaint: Chemical Exposure, Anxiety and Urinary Frequency   History of Present Illness:  Dana Perry is a 22 y.o. very pleasant female patient who presents with the following:  She inhaled "some insecticide" about 3 days ago.  She was actually visiting [place] at the time, it was some sort of spray used to keep bugs out of a room (not meant for skin).  She breathed it in and coughed for a couple of hours.  The cough is now resolved.  She still feels that her breathing is "a little staggered."   Since this incident she has noted feelings of anxiety  She also has noted some urinary sx- frequency.  She has noted this for about 3 days.  She does not have any dysuria. No hematuria.  No abdominal pain or fever  She does suffer from depression and anxiety- she takes lexapro and this usually works well for her.  She has been feeling anxious since this occurred.     Patient Active Problem List   Diagnosis Date Noted  . Yeast infection   . Mitral valve prolapse     Past Medical History  Diagnosis Date  . Yeast infection   . Mitral valve prolapse   . Anxiety   . Allergy   . Depression     Past Surgical History  Procedure Laterality Date  . Tonsillectomy    . Appendectomy      History  Substance Use Topics  . Smoking status: Never Smoker   . Smokeless tobacco: Never Used  . Alcohol Use: Yes    Family History  Problem Relation Age of Onset  . Mitral valve prolapse Maternal Grandmother   . Diabetes Father   . Diabetes Mother     No Known Allergies  Medication list has been reviewed and updated.  Current Outpatient Prescriptions on File Prior to Visit  Medication Sig Dispense Refill  . escitalopram (LEXAPRO) 20 MG tablet Take 40 mg by mouth daily.        No  current facility-administered medications on file prior to visit.    Review of Systems:  As per HPI- otherwise negative.   Physical Examination: Filed Vitals:   01/16/14 1258  BP: 120/72  Pulse: 76  Temp: 98.2 F (36.8 C)  Resp: 16   Filed Vitals:   01/16/14 1258  Height: 5\' 6"  (1.676 m)  Weight: 136 lb (61.689 kg)   Body mass index is 21.96 kg/(m^2). Ideal Body Weight: Weight in (lb) to have BMI = 25: 154.6  GEN: WDWN, NAD, Non-toxic, A & O x 3, looks well HEENT: Atraumatic, Normocephalic. Neck supple. No masses, No LAD.  Bilateral TM wnl, oropharynx normal.  PEERL,EOMI.   Ears and Nose: No external deformity. CV: RRR, No M/G/R. No JVD. No thrill. No extra heart sounds. PULM: CTA B, no wheezes, crackles, rhonchi. No retractions. No resp. distress. No accessory muscle use. ABD: S, NT, ND. No rebound. No HSM. EXTR: No c/c/e NEURO Normal gait.  PSYCH: Normally interactive. Conversant. Not depressed or anxious appearing.  Calm demeanor.   [facility] reading (PRIMARY) by  Dr. Lorelei Pont. CXR: negative  Results for orders placed in visit on 01/16/14  POCT UA - MICROSCOPIC ONLY  Result Value Ref Range   WBC, Ur, HPF, POC 4-6     RBC, urine, microscopic 2-24     Bacteria, U Microscopic 3+     Mucus, UA pos     Epithelial cells, urine per micros 10-TNTC     Crystals, Ur, HPF, POC neg     Casts, Ur, LPF, POC neg     Yeast, UA neg    POCT URINALYSIS DIPSTICK      Result Value Ref Range   Color, UA yellow     Clarity, UA slightly cloudy     Glucose, UA neg     Bilirubin, UA neg     Ketones, UA neg     Spec Grav, UA 1.025     Blood, UA moderate     pH, UA 7.0     Protein, UA 30mg      Urobilinogen, UA 1.0     Nitrite, UA neg     Leukocytes, UA Negative      Assessment and Plan: Dysuria - Plan: POCT UA - Microscopic Only, POCT urinalysis dipstick, Urine culture, cephALEXin (KEFLEX) 500 MG capsule  Exposure to chemical inhalation - Plan: DG Chest 2 View  Anxiety  state, unspecified - Plan: ALPRAZolam (XANAX) 0.25 MG tablet  Discussed need to be cautious with use of xanax as it can be sedating and habit forming.  However she would really like to have something to use as needed for anxiety.   Treat for likely UTI with keflex  Signed Lamar Blinks, MD

## 2014-01-18 ENCOUNTER — Encounter: Payer: Self-pay | Admitting: Family Medicine

## 2014-01-18 LAB — URINE CULTURE
COLONY COUNT: NO GROWTH
ORGANISM ID, BACTERIA: NO GROWTH

## 2014-01-18 NOTE — Addendum Note (Signed)
Addended by: Lamar Blinks C on: 01/18/2014 07:02 AM   Modules accepted: Orders

## 2014-04-15 ENCOUNTER — Ambulatory Visit (INDEPENDENT_AMBULATORY_CARE_PROVIDER_SITE_OTHER): Payer: BC Managed Care – PPO | Admitting: Family Medicine

## 2014-04-15 VITALS — BP 118/78 | HR 66 | Temp 98.2°F | Resp 16 | Ht 67.0 in | Wt 142.8 lb

## 2014-04-15 DIAGNOSIS — D239 Other benign neoplasm of skin, unspecified: Secondary | ICD-10-CM

## 2014-04-15 DIAGNOSIS — B360 Pityriasis versicolor: Secondary | ICD-10-CM

## 2014-04-15 DIAGNOSIS — D229 Melanocytic nevi, unspecified: Secondary | ICD-10-CM

## 2014-04-15 DIAGNOSIS — F411 Generalized anxiety disorder: Secondary | ICD-10-CM

## 2014-04-15 MED ORDER — ESCITALOPRAM OXALATE 20 MG PO TABS: 40.0000 mg | ORAL_TABLET | Freq: Every day | ORAL | Status: DC

## 2014-04-15 MED ORDER — ALPRAZOLAM 0.25 MG PO TABS: 0.2500 mg | ORAL_TABLET | Freq: Two times a day (BID) | ORAL | Status: DC | PRN

## 2014-04-15 NOTE — Patient Instructions (Signed)
Use a shampoo with SELENIUM SULFIDE in it as a body wash for the next 5-14d and the light spots across your shoulders and chest should go away. If the lesion on your thigh is not gone in 2-3 wks, come back so we can biopsy it and see if you need to see derm.  Tinea Versicolor Tinea versicolor is a common yeast infection of the skin. This condition becomes known when the yeast on our skin starts to overgrow (yeast is a normal inhabitant on our skin). This condition is noticed as white or light brown patches on brown skin, and is more evident in the summer on tanned skin. These areas are slightly scaly if scratched. The light patches from the yeast become evident when the yeast creates "holes in your suntan". This is most often noticed in the summer. The patches are usually located on the chest, back, pubis, neck and body folds. However, it may occur on any area of body. Mild itching and inflammation (redness or soreness) may be present. DIAGNOSIS  The diagnosisof this is made clinically (by looking). Cultures from samples are usually not needed. Examination under the microscope may help. However, yeast is normally found on skin. The diagnosis still remains clinical. Examination under Wood's Ultraviolet Light can determine the extent of the infection. TREATMENT  This common infection is usually only of cosmetic (only a concern to your appearance). It is easily treated with dandruff shampoo used during showers or bathing. Vigorous scrubbing will eliminate the yeast over several days time. The light areas in your skin may remain for weeks or months after the infection is cured unless your skin is exposed to sunlight. The lighter or darker spots caused by the fungus that remain after complete treatment are not a sign of treatment failure; it will take a long time to resolve. Your caregiver may recommend a number of commercial preparations or medication by mouth if home care is not working. Recurrence is common  and preventative medication may be necessary. This skin condition is not highly contagious. Special care is not needed to protect close friends and family members. Normal hygiene is usually enough. Follow up is required only if you develop complications (such as a secondary infection from scratching), if recommended by your caregiver, or if no relief is obtained from the preparations used. Document Released: 08/19/2000 Document Revised: 11/14/2011 Document Reviewed: 10/01/2008 [facility] Patient Information 2015 [place], [place]. This information is not intended to replace advice given to you by your health care provider. Make sure you discuss any questions you have with your health care provider.

## 2014-04-15 NOTE — Progress Notes (Signed)
   Subjective:    Patient ID: Dana Perry, female    DOB: Oct 06, 1990, 23 y.o.   MRN: [id number] Chief Complaint  Patient presents with  . Medication Refill    Lexapro- Just a 14 day supply    HPI  Seeing Dr. Toy Care - has appt w/ her later this month - in 1 wk. [place] this [place] so really didn't want to run out early.  Past Medical History  Diagnosis Date  . Yeast infection   . Mitral valve prolapse   . Anxiety   . Allergy   . Depression    No current outpatient prescriptions on file prior to visit.   No current facility-administered medications on file prior to visit.   No Known Allergies   Review of Systems  Constitutional: Negative for fever, chills, diaphoresis, activity change, appetite change, fatigue and unexpected weight change.  Cardiovascular: Negative for chest pain.  Gastrointestinal: Negative for nausea and vomiting.  Psychiatric/Behavioral: Positive for dysphoric mood. Negative for behavioral problems, confusion and agitation. The patient is nervous/anxious.        Objective:  BP 118/78  Pulse 66  Temp(Src) 98.2 F (36.8 C)  Resp 16  Ht 5\' 7"  (1.702 m)  Wt 142 lb 12.8 oz (64.774 kg)  BMI 22.36 kg/m2  SpO2 98%  LMP 04/06/2014  Physical Exam  Constitutional: She is oriented to person, place, and time. She appears well-developed and well-nourished. No distress.  HENT:  Head: Normocephalic and atraumatic.  Right Ear: External ear normal.  Left Ear: External ear normal.  Eyes: Conjunctivae are normal. No scleral icterus.  Neck: Normal range of motion. Neck supple. No thyromegaly present.  Cardiovascular: Normal rate, regular rhythm, normal heart sounds and intact distal pulses.   Pulmonary/Chest: Effort normal and breath sounds normal. No respiratory distress.  Musculoskeletal: She exhibits no edema.  Lymphadenopathy:    She has no cervical adenopathy.  Neurological: She is alert and oriented to person, place, and time.    Skin: Skin is warm and dry. She is not diaphoretic. No erythema.  Psychiatric: She has a normal mood and affect. Her behavior is normal.          Assessment & Plan:   Anxiety state, unspecified - Plan: ALPRAZolam (XANAX) 0.25 MG tablet - f/u w/ Dr. Toy Care  Tinea versicolor - try topical selenium shampoo for body wash as 1-2 wks.  Nevus  Meds ordered this encounter  Medications  . ALPRAZolam (XANAX) 0.25 MG tablet    Sig: Take 1 tablet (0.25 mg total) by mouth 2 (two) times daily as needed for anxiety.    Dispense:  10 tablet    Refill:  0  . escitalopram (LEXAPRO) 20 MG tablet    Sig: Take 2 tablets (40 mg total) by mouth daily.    Dispense:  28 tablet    Refill:  0    Delman Cheadle, MD MPH

## 2014-05-05 ENCOUNTER — Emergency Department (HOSPITAL_COMMUNITY)
Admission: EM | Admit: 2014-05-05 | Discharge: 2014-05-05 | Disposition: A | Discharge status: Home / Self Care | Payer: BC Managed Care – PPO | Attending: Emergency Medicine | Admitting: Emergency Medicine

## 2014-05-05 ENCOUNTER — Encounter (HOSPITAL_COMMUNITY): Payer: Self-pay | Admitting: Emergency Medicine

## 2014-05-05 DIAGNOSIS — T7589XA Other specified effects of external causes, initial encounter: Secondary | ICD-10-CM

## 2014-05-05 DIAGNOSIS — Z8679 Personal history of other diseases of the circulatory system: Secondary | ICD-10-CM | POA: Diagnosis not present

## 2014-05-05 DIAGNOSIS — F3289 Other specified depressive episodes: Secondary | ICD-10-CM | POA: Diagnosis not present

## 2014-05-05 DIAGNOSIS — Z79899 Other long term (current) drug therapy: Secondary | ICD-10-CM | POA: Insufficient documentation

## 2014-05-05 DIAGNOSIS — W57XXXA Bitten or stung by nonvenomous insect and other nonvenomous arthropods, initial encounter: Secondary | ICD-10-CM | POA: Diagnosis not present

## 2014-05-05 DIAGNOSIS — Y929 Unspecified place or not applicable: Secondary | ICD-10-CM | POA: Insufficient documentation

## 2014-05-05 DIAGNOSIS — F329 Major depressive disorder, single episode, unspecified: Secondary | ICD-10-CM | POA: Insufficient documentation

## 2014-05-05 DIAGNOSIS — Y939 Activity, unspecified: Secondary | ICD-10-CM | POA: Insufficient documentation

## 2014-05-05 DIAGNOSIS — Z8619 Personal history of other infectious and parasitic diseases: Secondary | ICD-10-CM | POA: Insufficient documentation

## 2014-05-05 DIAGNOSIS — F411 Generalized anxiety disorder: Secondary | ICD-10-CM | POA: Diagnosis not present

## 2014-05-05 DIAGNOSIS — S40269A Insect bite (nonvenomous) of unspecified shoulder, initial encounter: Secondary | ICD-10-CM | POA: Diagnosis present

## 2014-05-05 DIAGNOSIS — R42 Dizziness and giddiness: Secondary | ICD-10-CM | POA: Insufficient documentation

## 2014-05-05 NOTE — ED Provider Notes (Signed)
CSN: [id number]     Arrival date & time 05/05/14  2111 History  This chart was scribed for non-physician practitioner, Pura Spice, PA-C working with Carmin Muskrat, MD by Frederich Balding, ED scribe. This patient was seen in room TR05C/TR05C and the patient's care was started at 10:23 PM.   Chief Complaint  Patient presents with  . Insect Bite   The history is provided by the patient. No language interpreter was used.   HPI Comments: Dana Perry is a 23 y.o. female who presents to the Emergency Department complaining of an insect bite to her left upper arm that occurred about one hour ago. She felt a bite and thinks it might have been a black widow spider. Denies any pain or itching around the area. Reports myalgias and weakness in her left arm. Pt denies abdominal pain.  No N/V/D. Denies fever, chills, trouble swallowing, throat closing sensation, SOB, difficulty breathing, chest pain, neck stiffness, rash, headaches.   Past Medical History  Diagnosis Date  . Yeast infection   . Mitral valve prolapse   . Anxiety   . Allergy   . Depression    Past Surgical History  Procedure Laterality Date  . Tonsillectomy    . Appendectomy     Family History  Problem Relation Age of Onset  . Mitral valve prolapse Maternal Grandmother   . Diabetes Father   . Diabetes Mother    History  Substance Use Topics  . Smoking status: Never Smoker   . Smokeless tobacco: Never Used  . Alcohol Use: Yes   OB History   Grav Para Term Preterm Abortions TAB SAB Ect Mult Living   0 0 0 0 0 0 0 0 0 0      Review of Systems  Constitutional: Negative for fever and chills.  HENT: Negative for trouble swallowing.   Respiratory: Negative for shortness of breath.   Cardiovascular: Negative for chest pain.  Musculoskeletal: Positive for myalgias. Negative for neck stiffness.  Skin: Negative for rash.  Neurological: Positive for weakness and light-headedness.  All other systems reviewed and are  negative.  Allergies  Review of patient's allergies indicates no known allergies.  Home Medications   Prior to Admission medications   Medication Sig Start Date End Date Taking? Authorizing Provider  escitalopram (LEXAPRO) 20 MG tablet Take 40 mg by mouth every evening.   Yes Historical Provider, MD  PRESCRIPTION MEDICATION Take 1 tablet by mouth at bedtime. Allergy medication   Yes Historical Provider, MD   BP 103/62  Pulse 69  Temp(Src) 98.5 F (36.9 C) (Oral)  Resp 14  Ht 5\' 6"  (1.676 m)  Wt 148 lb (67.132 kg)  BMI 23.90 kg/m2  SpO2 98%  LMP 04/06/2014  Physical Exam  Nursing note and vitals reviewed. Constitutional: She is oriented to person, place, and time. She appears well-developed and well-nourished. No distress.  HENT:  Head: Normocephalic and atraumatic.  Mouth/Throat: Oropharynx is clear and moist.  No oral or lip swelling.  Eyes: Conjunctivae and EOM are normal. Pupils are equal, round, and reactive to light.  Neck: Normal range of motion. Neck supple. No tracheal deviation present.  Cardiovascular: Normal rate, regular rhythm and normal heart sounds.   Pulmonary/Chest: Effort normal and breath sounds normal. No respiratory distress. She has no wheezes. She has no rales.  Abdominal: Soft. Bowel sounds are normal. She exhibits no distension. There is no tenderness.  Musculoskeletal: Normal range of motion.  Normal muscle tone.  Lymphadenopathy:  She has no cervical adenopathy.  Neurological: She is alert and oriented to person, place, and time.  Reflex Scores:      Patellar reflexes are 2+ on the right side and 2+ on the left side. Cranial nerves 3-12 grossly intact.Strength 5/5 in upper and lower extremities. Sensation intact. Intact rapid alternating movements, finger to nose, and heel to shin. Negative Romberg. Normal gait.   Skin: Skin is warm and dry.  No bites visualized.   Psychiatric: She has a normal mood and affect. Her behavior is normal.     ED Course  Procedures (including critical care time)  DIAGNOSTIC STUDIES: Oxygen Saturation is 98% on RA, normal by my interpretation.    COORDINATION OF CARE: 10:32 PM-Discussed treatment plan which includes return precautions with pt at bedside and pt agreed to plan.   Labs Review Labs Reviewed - No data to display  Imaging Review No results found.   EKG Interpretation None     Meds given in ED:  Medications - No data to display  New Prescriptions   No medications on file      MDM   Final diagnoses:  Exposure, initial encounter   Dana Perry is a 23 y.o. female complaining of exposure to spider with potential bite. Patient endorses feeling as if she was bitten but has seen no bites. Pt VSS with completely normal neurological exam and no bites were visualized. No muscle rigidity. Normal tone. Lungs clear. I doubt black widow spider bite. Discussed coming back if any bite marks appear, fevers, difficulty breathing and other return precautions. Patient is afebrile, nontoxic, and in no acute distress. Patient is appropriate for outpatient management and is stable for discharge.  Discussed return precautions with patient. Discussed all results and patient verbalizes understanding and agrees with plan.  I personally performed the services described in this documentation, which was scribed in my presence. The recorded information has been reviewed and is accurate.  Pura Spice, PA-C 05/05/14 2337

## 2014-05-05 NOTE — ED Notes (Signed)
Patient came out of the room and stated she didn't get bit by the spider and wanted to know if she would be charged for the visit if she was to leave now. Explained to her that she would and she decided to wait for the provider.

## 2014-05-05 NOTE — ED Notes (Signed)
Patient states she saw a spider that she describes as a black widow on her left posterior arm just above her elbow and she states "I felt it bite me and my arm feels weird, different than my other arm". No redness, or puncture site noted at this time.

## 2014-05-05 NOTE — ED Notes (Signed)
Pt. reports insect bite ( ? spider ) at posterior left upper arm this evening , denies itching or pain . Respirations unlabored / airway intact .

## 2014-05-05 NOTE — Discharge Instructions (Signed)
Return to the emergency room with worsening of symptoms, new symptoms or with symptoms that are concerning, especially bite wound, fevers, difficulty breathing, swallowing, lightheadedness or fainting.

## 2014-05-05 NOTE — ED Provider Notes (Signed)
  Medical screening examination/treatment/procedure(s) were performed by non-physician practitioner and as supervising physician I was immediately available for consultation/collaboration.   EKG Interpretation None         Carmin Muskrat, MD 05/05/14 2337

## 2014-06-25 ENCOUNTER — Telehealth: Payer: Self-pay

## 2014-06-25 MED ORDER — ALPRAZOLAM 0.25 MG PO TBDP: 0.2500 mg | ORAL_TABLET | Freq: Two times a day (BID) | ORAL | Status: DC | PRN

## 2014-06-25 MED ORDER — ALPRAZOLAM 0.25 MG PO TABS: 0.2500 mg | ORAL_TABLET | Freq: Two times a day (BID) | ORAL | Status: DC | PRN
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
## 2014-06-25 NOTE — Telephone Encounter (Signed)
This was ordered as ODT. Called it in as a tablet

## 2014-06-25 NOTE — Addendum Note (Signed)
Addended by: Constance Goltz on: 06/25/2014 07:45 PM   Modules accepted: Orders, Medications

## 2014-06-25 NOTE — Telephone Encounter (Signed)
Reviewed [facility] - pt has not had any controlled sub filled in past yr. Xanax rx re-rprinted for her to pick up.

## 2014-07-16 ENCOUNTER — Ambulatory Visit (INDEPENDENT_AMBULATORY_CARE_PROVIDER_SITE_OTHER): Payer: BC Managed Care – PPO | Admitting: Family Medicine

## 2014-07-16 VITALS — BP 110/70 | HR 76 | Temp 97.1°F | Resp 16 | Ht 67.0 in | Wt 142.0 lb

## 2014-07-16 DIAGNOSIS — K529 Noninfective gastroenteritis and colitis, unspecified: Secondary | ICD-10-CM

## 2014-07-16 NOTE — Progress Notes (Signed)
   Subjective:   This chart was scribed for Dana Haber, MD by Forrestine Him, [facility] Scribe. This patient was seen in room 2 and the patient's care was started 7:35 PM.    Patient ID: Dana Perry, female    DOB: 20-May-1991, 23 y.o.   MRN: [id number]  Chief Complaint  Patient presents with  . Constipation    x 1 week   . Diarrhea  . Nausea    HPI  HPI Comments: Dana Perry is a 23 y.o. female who [facility] here for intermittent, moderate constipation x 1 week that is unchanged. She has tried castor oil with improvement for symptoms. However, pt states constipations returned. She tried Milk of Magnesia today with improvement but states with each remedy, constipation returns after eating. She admits to consuming a large amount of alcohol prior to onset of constipation. No known allergies to medications. No other concerns this visit.  Patient Active Problem List   Diagnosis Date Noted  . Yeast infection   . Mitral valve prolapse    Past Medical History  Diagnosis Date  . Yeast infection   . Mitral valve prolapse   . Anxiety   . Allergy   . Depression    Past Surgical History  Procedure Laterality Date  . Tonsillectomy    . Appendectomy     No Known Allergies Prior to Admission medications   Medication Sig Start Date End Date Taking? Authorizing Provider  escitalopram (LEXAPRO) 20 MG tablet Take 40 mg by mouth every evening.   Yes Historical Provider, MD  PRESCRIPTION MEDICATION Take 1 tablet by mouth at bedtime. Allergy medication   Yes Historical Provider, MD  ALPRAZolam (XANAX) 0.25 MG tablet Take 1 tablet (0.25 mg total) by mouth 2 (two) times daily as needed for anxiety. 06/25/14   Shawnee Knapp, MD     Review of Systems  Constitutional: Negative for fever and chills.  Gastrointestinal: Positive for abdominal pain and constipation.    Triage Vitals: BP 110/70 mmHg  Pulse 76  Temp(Src) 97.1 F (36.2  C) (Oral)  Resp 16  Ht 5\' 7"  (1.702 m)  Wt 142 lb (64.411 kg)  BMI 22.24 kg/m2  SpO2 96%  LMP 07/12/2014   Objective:  Physical Exam  Constitutional: She is oriented to person, place, and time. She appears well-developed and well-nourished.  HENT:  Head: Normocephalic.  Eyes: EOM are normal.  Neck: Normal range of motion.  Pulmonary/Chest: Effort normal.  Abdominal:  No HSA or masses Hyperactive bowel sounds noted Mild distention  Musculoskeletal: Normal range of motion.  Neurological: She is alert and oriented to person, place, and time.  Psychiatric: She has a normal mood and affect.  Nursing note and vitals reviewed.   Assessment & Plan:   I personally performed the services described in this documentation, which was scribed in my presence. The recorded information has been reviewed and is accurate.    Noninfectious gastroenteritis, unspecified Probiotic and miralax Signed, Dana Haber, MD

## 2014-07-16 NOTE — Patient Instructions (Signed)
You need to take probiotics for 10 days. I would recommend that you take MiraLAX one half capsule in water daily for those 10 days This far as eating, chicken soup, pasta of any kind would be reasonable but avoid dairy products for at least 2 days

## 2014-09-02 ENCOUNTER — Ambulatory Visit (INDEPENDENT_AMBULATORY_CARE_PROVIDER_SITE_OTHER): Payer: BC Managed Care – PPO | Admitting: Family Medicine

## 2014-09-02 VITALS — BP 110/60 | HR 87 | Temp 98.1°F | Resp 16 | Ht 66.5 in | Wt 148.2 lb

## 2014-09-02 DIAGNOSIS — Z124 Encounter for screening for malignant neoplasm of cervix: Secondary | ICD-10-CM

## 2014-09-02 DIAGNOSIS — N898 Other specified noninflammatory disorders of vagina: Secondary | ICD-10-CM

## 2014-09-02 DIAGNOSIS — Z113 Encounter for screening for infections with a predominantly sexual mode of transmission: Secondary | ICD-10-CM

## 2014-09-02 LAB — POCT WET PREP WITH KOH
KOH Prep POC: NEGATIVE
Trichomonas, UA: NEGATIVE
Yeast Wet Prep HPF POC: NEGATIVE

## 2014-09-02 NOTE — Progress Notes (Signed)
Chief Complaint:  Chief Complaint  Patient presents with  . Exposure to STD    HPI: Dana Perry is a 23 y.o. female who is here for STD screening Has boyfriend and last 6 months ago was last time had intercourse She never has had an STD She just graduated, She is going to see her boyfriend in [place] She declines flu vaccine  LMP 3 weeks  Pap was 1-1.5 years ago No prior abnormal pap No priro STDs G0  Past Medical History  Diagnosis Date  . Yeast infection   . Mitral valve prolapse   . Anxiety   . Allergy   . Depression    Past Surgical History  Procedure Laterality Date  . Tonsillectomy    . Appendectomy     History   Social History  . Marital Status: Single    Spouse Name: N/A    Number of Children: N/A  . Years of Education: N/A   Social History Main Topics  . Smoking status: Never Smoker   . Smokeless tobacco: Never Used  . Alcohol Use: Yes  . Drug Use: No  . Sexual Activity: Yes    Birth Control/ Protection: Condom   Other Topics Concern  . None   Social History Narrative   Family History  Problem Relation Age of Onset  . Mitral valve prolapse Maternal Grandmother   . Diabetes Father   . Diabetes Mother    No Known Allergies Prior to Admission medications   Medication Sig Start Date End Date Taking? Authorizing Provider  ALPRAZolam (XANAX) 0.25 MG tablet Take 1 tablet (0.25 mg total) by mouth 2 (two) times daily as needed for anxiety. 06/25/14  Yes Shawnee Knapp, MD  escitalopram (LEXAPRO) 20 MG tablet Take 40 mg by mouth every evening.   Yes Historical Provider, MD  PRESCRIPTION MEDICATION Take 1 tablet by mouth at bedtime. Allergy medication   Yes Historical Provider, MD     ROS: The patient denies fevers, chills, night sweats, unintentional weight loss, chest pain, palpitations, wheezing, dyspnea on exertion, nausea, vomiting, abdominal pain, dysuria, hematuria, melena, numbness, weakness, or tingling.   All other systems have  been reviewed and were otherwise negative with the exception of those mentioned in the HPI and as above.    PHYSICAL EXAM: Filed Vitals:   09/02/14 1728  BP: 110/60  Pulse: 87  Temp: 98.1 F (36.7 C)  Resp: 16   Filed Vitals:   09/02/14 1728  Height: 5' 6.5" (1.689 m)  Weight: 148 lb 3.2 oz (67.223 kg)   Body mass index is 23.56 kg/(m^2).  General: Alert, no acute distress HEENT:  Normocephalic, atraumatic, oropharynx patent. EOMI, PERRLA Cardiovascular:  Regular rate and rhythm, no rubs murmurs or gallops.  No Carotid bruits, radial pulse intact. No pedal edema.  Respiratory: Clear to auscultation bilaterally.  No wheezes, rales, or rhonchi.  No cyanosis, no use of accessory musculature GI: No organomegaly, abdomen is soft and non-tender, positive bowel sounds.  No masses. Skin: No rashes. Neurologic: Facial musculature symmetric. Psychiatric: Patient is appropriate throughout our interaction. Lymphatic: No cervical lymphadenopathy Musculoskeletal: Gait intact. GU-with dc, no masses lesions, normal cervix, nulliparous in appearance   LABS: Results for orders placed or performed in visit on 09/02/14  POCT Wet Prep with KOH  Result Value Ref Range   Trichomonas, UA Negative    Clue Cells Wet Prep HPF POC 1-3    Epithelial Wet Prep HPF POC 2-5  Yeast Wet Prep HPF POC neg    Bacteria Wet Prep HPF POC 3+    RBC Wet Prep HPF POC 0-2    WBC Wet Prep HPF POC 6-10    KOH Prep POC Negative      EKG/XRAY:   Primary read interpreted by Dr. Marin Comment at [facility].   ASSESSMENT/PLAN: Encounter Diagnoses  Name Primary?  . Screening for STD (sexually transmitted disease) Yes  . Pap smear for cervical cancer screening   . Vaginal discharge    Normal vaginal dc STD labs pending and pap results pending F/u prn   Gross sideeffects, risk and benefits, and alternatives of medications d/w patient. Patient is aware that all medications have potential sideeffects and we are unable to  predict every sideeffect or drug-drug interaction that may occur.  Patrice Moates, [place], DO 09/02/2014 9:42 PM

## 2014-09-03 LAB — HIV ANTIBODY (ROUTINE TESTING W REFLEX): HIV 1&2 Ab, 4th Generation: NONREACTIVE

## 2014-09-03 LAB — RPR

## 2014-09-03 LAB — HEPATITIS B SURFACE ANTIGEN: Hepatitis B Surface Ag: NEGATIVE

## 2014-09-03 LAB — HEPATITIS B SURFACE ANTIBODY, QUANTITATIVE: Hep B S AB Quant (Post): 1.6 m[IU]/mL

## 2014-09-03 LAB — HEPATITIS C ANTIBODY: HCV Ab: NEGATIVE

## 2014-09-04 LAB — PAP IG, CT-NG, RFX HPV ASCU
Chlamydia Probe Amp: NEGATIVE
GC Probe Amp: NEGATIVE

## 2014-09-04 LAB — HSV(HERPES SIMPLEX VRS) I + II AB-IGG
HSV 1 Glycoprotein G Ab, IgG: <0.1 IV
HSV 2 Glycoprotein G Ab, IgG: <0.1 IV

## 2014-11-12 ENCOUNTER — Ambulatory Visit (INDEPENDENT_AMBULATORY_CARE_PROVIDER_SITE_OTHER): Payer: BLUE CROSS/BLUE SHIELD | Admitting: Physician Assistant

## 2014-11-12 VITALS — BP 110/70 | HR 80 | Temp 97.8°F | Resp 16 | Ht 66.5 in | Wt 146.0 lb

## 2014-11-12 DIAGNOSIS — L237 Allergic contact dermatitis due to plants, except food: Secondary | ICD-10-CM | POA: Diagnosis not present

## 2014-11-12 MED ORDER — METHYLPREDNISOLONE ACETATE 80 MG/ML IJ SUSP
80.0000 mg | Freq: Once | INTRAMUSCULAR | Status: AC
  Administered 2014-11-12: 80 mg via INTRAMUSCULAR

## 2014-11-12 MED ORDER — PREDNISONE 20 MG PO TABS: ORAL_TABLET | ORAL | Status: DC

## 2014-11-12 NOTE — Patient Instructions (Signed)
Start taking prednisone tomorrow and finish out course - take in the mornings when you wake so it doesn't keep you from sleeping at night. You can take benadryl at night to help you sleep. Return if not getting better after you finish the steroids.
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
## 2014-11-14 NOTE — Progress Notes (Signed)
  Medical screening examination/treatment/procedure(s) were performed by non-physician practitioner and as supervising physician I was immediately available for consultation/collaboration.     

## 2015-01-09 ENCOUNTER — Ambulatory Visit (INDEPENDENT_AMBULATORY_CARE_PROVIDER_SITE_OTHER): Payer: BLUE CROSS/BLUE SHIELD | Admitting: Internal Medicine

## 2015-01-09 VITALS — BP 110/60 | HR 75 | Temp 98.0°F | Resp 16 | Ht 67.0 in | Wt 145.1 lb

## 2015-01-09 DIAGNOSIS — Z202 Contact with and (suspected) exposure to infections with a predominantly sexual mode of transmission: Secondary | ICD-10-CM

## 2015-01-09 NOTE — Progress Notes (Signed)
   Subjective:    Patient ID: Dana Perry, female    DOB: 10/20/1990, 24 y.o.   MRN: [id number] This chart was scribed for Dana Lin, MD by Zola Button, Medical Scribe. This patient was seen in Room 12 and the patient's care was started at 4:33 PM.   HPI HPI Comments: Dana Perry is a 24 y.o. female who presents to the [facility] for STD testing; she does not have any symptoms, but wants to be tested to be sure she does not have any STDs. Patient reports new sexual partner as of 2 months ago, using condoms every time. She reports having some mild pain with intercourse sometimes, but she attributes this to not being lubricated enough. Patient denies lesions, rash, dysuria, vaginal discharge, and vaginal itching. She does not have any suspicion of risky behavior in her partner, including IV drug use. She has always tested negative in prior STD tests, including herpes.  Patient recently got a job at [employer]. 3 ltp commun major  Review of Systems noncon    Objective:   Physical Exam  Constitutional: She is oriented to person, place, and time. She appears well-developed and well-nourished. No distress.  HENT:  Head: Normocephalic and atraumatic.  Eyes: Pupils are equal, round, and reactive to light.  Neck: Neck supple.  Cardiovascular: Normal rate.   Pulmonary/Chest: Effort normal.  Neurological: She is alert and oriented to person, place, and time. No cranial nerve deficit.  Psychiatric: She has a normal mood and affect. Her behavior is normal.  Vitals reviewed. BP 110/60 mmHg  Pulse 75  Temp(Src) 98 F (36.7 C) (Oral)  Resp 16  Ht 5\' 7"  (1.702 m)  Wt 145 lb 2 oz (65.828 kg)  BMI 22.72 kg/m2  SpO2 98%  LMP 01/05/2015         Assessment & Plan:  Possible exposure to STD - Plan: HSV(herpes simplex vrs) 1+2 ab-IgG, HIV antibody, RPR, GC/Chlamydia Probe Amp  Disc STD in gen/[facility] resource Call results  I have completed the patient  encounter in its entirety as documented by the scribe, with editing by me where necessary. Eisha Chatterjee P. Laney Pastor, M.D.

## 2015-01-10 LAB — GC/CHLAMYDIA PROBE AMP
CT PROBE, AMP APTIMA: NEGATIVE
GC Probe RNA: NEGATIVE

## 2015-01-10 LAB — HIV ANTIBODY (ROUTINE TESTING W REFLEX): HIV 1&2 Ab, 4th Generation: NONREACTIVE

## 2015-01-10 LAB — RPR

## 2015-01-12 LAB — HSV(HERPES SIMPLEX VRS) I + II AB-IGG
HSV 1 Glycoprotein G Ab, IgG: <0.1 IV
HSV 2 Glycoprotein G Ab, IgG: <0.1 IV

## 2015-03-10 ENCOUNTER — Ambulatory Visit (INDEPENDENT_AMBULATORY_CARE_PROVIDER_SITE_OTHER): Payer: BLUE CROSS/BLUE SHIELD | Admitting: Internal Medicine

## 2015-03-10 VITALS — BP 107/74 | HR 74 | Temp 98.1°F | Resp 18 | Ht 66.5 in | Wt 139.8 lb

## 2015-03-10 DIAGNOSIS — Z124 Encounter for screening for malignant neoplasm of cervix: Secondary | ICD-10-CM | POA: Diagnosis not present

## 2015-03-10 DIAGNOSIS — Z3200 Encounter for pregnancy test, result unknown: Secondary | ICD-10-CM

## 2015-03-10 DIAGNOSIS — Z202 Contact with and (suspected) exposure to infections with a predominantly sexual mode of transmission: Secondary | ICD-10-CM | POA: Diagnosis not present

## 2015-03-10 DIAGNOSIS — F329 Major depressive disorder, single episode, unspecified: Secondary | ICD-10-CM

## 2015-03-10 DIAGNOSIS — F32A Depression, unspecified: Secondary | ICD-10-CM | POA: Insufficient documentation

## 2015-03-10 DIAGNOSIS — F411 Generalized anxiety disorder: Secondary | ICD-10-CM | POA: Diagnosis not present

## 2015-03-10 LAB — POCT URINE PREGNANCY: Preg Test, Ur: NEGATIVE

## 2015-03-10 MED ORDER — ALPRAZOLAM 0.5 MG PO TABS: ORAL_TABLET | ORAL | Status: DC

## 2015-03-10 NOTE — Progress Notes (Signed)
Subjective:  This chart was scribed for Dana Lin, MD by Leandra Kern, Medical Scribe. This patient was seen in Room 5 and the patient's care was started at 4:51 PM.   Patient ID: Dana Perry, female    DOB: [birth year], 24 y.o.   MRN: [id number]  HPI HPI Comments: Dana Perry is a 24 y.o. female with a PMHx of yeast infection and depression who presents to [facility] requesting an STD testing. Pt was recently seen here on 01/09/2015 for the same reason and she tested negative. Today, pt notes that she would like to get tested because she is worried about her partner's possibility of having sexual relationships with other women. Pt denies having any associative symptoms such as vaginal discharge or dysuria. Pt reports that she has been having troubles with her personal life, and that she blames herself for it. Pt had her last menstrual couple of days ago, however she had a sexual intercourse a day before that. Therefore, she is interested in having a pregnancy test. They were apart for a brief spell between her last visit and now.  Pt notes that her family are the supporting system for her depression symptoms that started her freshman year in college. Pt is compliant with takeing Lexapro.  She does not currently get any therapy for it, however, she notes that her father is a Education officer, museum and it helps talking to him. Pt reports having a past family history of depression (father, mother, and sister). Psychiatrist is Dr.Kaur. Has follow-up appointment in August. Needs refill for when necessary use of Xanax for panic attacks until she can see her.  Pt notes that she had ended things with her last partner, and then had this most recent episode. She plans for this relationship to the over. She plans no new partners Her old love is returning from the [place] in August though uncertain whether this will start a new relationship. Active Ambulatory Problems   Diagnosis Date Noted  . Yeast infection   . Mitral valve prolapse    Resolved Ambulatory Problems    Diagnosis Date Noted  . No Resolved Ambulatory Problems   Past Medical History  Diagnosis Date  . Anxiety   . Allergy   . Depression    Current Outpatient Prescriptions on File Prior to Visit  Medication Sig Dispense Refill  . escitalopram (LEXAPRO) 20 MG tablet Take 40 mg by mouth every evening.    . predniSONE (DELTASONE) 20 MG tablet Take 3 PO QAM x3days, 2 PO QAM x3days, 1 PO QAM x3days (Patient not taking: Reported on 01/09/2015) 18 tablet 0  . PRESCRIPTION MEDICATION Take 1 tablet by mouth at bedtime. Allergy medication     No current facility-administered medications on file prior to visit.   No Known Allergies   Review of Systems  Genitourinary: Negative for dysuria, vaginal bleeding and vaginal discharge.  Skin: Negative for rash.      Objective:   Physical Exam  Constitutional: She is oriented to person, place, and time. She appears well-developed and well-nourished. No distress.  HENT:  Head: Normocephalic and atraumatic.  Eyes: EOM are normal. Pupils are equal, round, and reactive to light.  Neck: Neck supple.  Cardiovascular: Normal rate.   Pulmonary/Chest: Effort normal.  Neurological: She is alert and oriented to person, place, and time. No cranial nerve deficit.  Skin: Skin is warm and dry.  Psychiatric: She has a normal mood and affect. Her behavior is normal.  Nursing note and vitals reviewed.     Assessment & Plan:  I have completed the patient encounter in its entirety as documented by the scribe, with editing by me where necessary. Maja Mccaffery P. Laney Pastor, M.D.  Possible pregnancy - urine pregnancy test negative so she was reassured Possible exposure to STD  Possible STD exposure --- screen for chlamydia and gonorrhea//exposure pattern suggest our prior screening for others is good enough  Pap smear for cervical cancer screening-at her  request  Depression Generalized anxiety disorder  Continue Lexapro  Add 0.5 Xanax for panic when necessary  Follow-up with psychiatry in August  Consider counseling  Consider depression for dummies

## 2015-03-10 NOTE — Progress Notes (Signed)
GU Exam Labia without erythema or lesions. Physiologic discharge present in vaginal canal. Vagina without abnormalities or tenderness. Cervix without erythema, lesions, or blood. No CMT. No adnexal tenderness or masses.

## 2015-03-12 ENCOUNTER — Telehealth: Payer: Self-pay

## 2015-03-12 LAB — PAP IG, CT-NG, RFX HPV ASCU
Chlamydia Probe Amp: NEGATIVE
GC PROBE AMP: NEGATIVE

## 2015-03-12 NOTE — Telephone Encounter (Signed)
Patient is returning a missed phone call from labs. She asked if a detailed message could be left on her phone if she doesn't answer. [phone]

## 2015-03-13 NOTE — Telephone Encounter (Signed)
lmom to cb. 

## 2015-03-30 ENCOUNTER — Ambulatory Visit (INDEPENDENT_AMBULATORY_CARE_PROVIDER_SITE_OTHER): Payer: BLUE CROSS/BLUE SHIELD | Admitting: Family Medicine

## 2015-03-30 VITALS — BP 118/62 | HR 77 | Temp 98.4°F | Resp 18 | Ht 66.5 in | Wt 146.0 lb

## 2015-03-30 DIAGNOSIS — Z113 Encounter for screening for infections with a predominantly sexual mode of transmission: Secondary | ICD-10-CM | POA: Diagnosis not present

## 2015-03-30 DIAGNOSIS — Z23 Encounter for immunization: Secondary | ICD-10-CM

## 2015-03-30 DIAGNOSIS — Z7251 High risk heterosexual behavior: Secondary | ICD-10-CM

## 2015-03-30 DIAGNOSIS — R350 Frequency of micturition: Secondary | ICD-10-CM

## 2015-03-30 LAB — POCT URINALYSIS DIPSTICK
Bilirubin, UA: NEGATIVE
Glucose, UA: NEGATIVE
Ketones, UA: NEGATIVE
LEUKOCYTES UA: NEGATIVE
Nitrite, UA: NEGATIVE
PH UA: 7
Protein, UA: NEGATIVE
Spec Grav, UA: 1.01
Urobilinogen, UA: 0.2

## 2015-03-30 LAB — POCT UA - MICROSCOPIC ONLY
Casts, Ur, LPF, POC: NEGATIVE
Crystals, Ur, HPF, POC: NEGATIVE
Mucus, UA: NEGATIVE
Yeast, UA: NEGATIVE

## 2015-03-30 LAB — POCT URINE PREGNANCY: PREG TEST UR: NEGATIVE

## 2015-03-30 MED ORDER — NITROFURANTOIN MONOHYD MACRO 100 MG PO CAPS: 100.0000 mg | ORAL_CAPSULE | Freq: Two times a day (BID) | ORAL | Status: DC

## 2015-03-30 NOTE — Progress Notes (Signed)
[facility] [address], [place] [address] [phone]  Date:  03/30/2015   Name:  Dana Perry   DOB:  1991/03/02   MRN:  [id number]  PCP:  Pcp Not In System    Chief Complaint: Urinary Frequency   History of Present Illness:  Dana Perry is a 24 y.o. very pleasant female patient who presents with the following:  Generally healthy young lady here today with concern about UTI.  She had an STI work up and pap at her last visit earlier this month  LMP 6/28.  However she also had unprotected sex with a new partner last week- it was "just for a second, then we stopped" when she realized they did not have protection at hand.  This occurred 5 days ago   She has noted some symptoms of a UTI for about 2 weeks- coming and going.  She tried to self- treat with fluids, cranberry juice.  She does not have dysuria, but she has constant urgency and a feeling of pressure. She has not noted any hematuria.  No vomiting, belly pain or fever.  She has noted a little back pain She does not have any vaginal sx- however her urine does smell a bit strong  She does like using condoms for contraception, generally uses these successfully and consistently.  She has not done well with hormonal contraception in the past and prefers not to use this   Discussed gardasil- she has not yet had this series and would like to start today  Patient Active Problem List   Diagnosis Date Noted  . Generalized anxiety disorder 03/10/2015  . Depression 03/10/2015  . Yeast infection   . Mitral valve prolapse     Past Medical History  Diagnosis Date  . Yeast infection   . Mitral valve prolapse   . Anxiety   . Allergy   . Depression     Past Surgical History  Procedure Laterality Date  . Tonsillectomy    . Appendectomy      History  Substance Use Topics  . Smoking status: Former Research scientist (life sciences)  . Smokeless tobacco: Never Used  . Alcohol Use: 1.8 oz/week    3 Standard drinks or  equivalent per week    Family History  Problem Relation Age of Onset  . Mitral valve prolapse Maternal Grandmother   . Diabetes Father   . Diabetes Mother     No Known Allergies  Medication list has been reviewed and updated.  Current Outpatient Prescriptions on File Prior to Visit  Medication Sig Dispense Refill  . ALPRAZolam (XANAX) 0.5 MG tablet When necessary panic attack 15 tablet 0  . escitalopram (LEXAPRO) 20 MG tablet Take 40 mg by mouth every evening.    . predniSONE (DELTASONE) 20 MG tablet Take 3 PO QAM x3days, 2 PO QAM x3days, 1 PO QAM x3days (Patient not taking: Reported on 01/09/2015) 18 tablet 0  . PRESCRIPTION MEDICATION Take 1 tablet by mouth at bedtime. Allergy medication     No current facility-administered medications on file prior to visit.    Review of Systems:  As per HPI- otherwise negative.   Physical Examination: Filed Vitals:   03/30/15 1328  BP: 118/62  Pulse: 77  Temp: 98.4 F (36.9 C)  Resp: 18   Filed Vitals:   03/30/15 1328  Weight: 146 lb (66.225 kg)   Body mass index is 23.21 kg/(m^2). Ideal Body Weight:    GEN: WDWN, NAD, Non-toxic, A &  O x 3, looks well HEENT: Atraumatic, Normocephalic. Neck supple. No masses, No LAD. Ears and Nose: No external deformity. CV: RRR, No M/G/R. No JVD. No thrill. No extra heart sounds. PULM: CTA B, no wheezes, crackles, rhonchi. No retractions. No resp. distress. No accessory muscle use. ABD: S, NT, ND, +BS. No rebound. No HSM. EXTR: No c/c/e NEURO Normal gait.  PSYCH: Normally interactive. Conversant. Not depressed or anxious appearing.  Calm demeanor.  No CVA tenderness, abd is benign   Results for orders placed or performed in visit on 03/30/15  POCT UA - Microscopic Only  Result Value Ref Range   WBC, Ur, HPF, POC 0-2    RBC, urine, microscopic 0-1    Bacteria, U Microscopic trace    Mucus, UA negative    Epithelial cells, urine per micros 0-8    Crystals, Ur, HPF, POC negative     Casts, Ur, LPF, POC negative    Yeast, UA negative   POCT urinalysis dipstick  Result Value Ref Range   Color, UA yellow    Clarity, UA clear    Glucose, UA negative    Bilirubin, UA negative    Ketones, UA negative    Spec Grav, UA 1.010    Blood, UA small    pH, UA 7.0    Protein, UA negative    Urobilinogen, UA 0.2    Nitrite, UA negative    Leukocytes, UA Negative Negative  POCT urine pregnancy  Result Value Ref Range   Preg Test, Ur Negative Negative     Assessment and Plan: Urinary frequency - Plan: POCT UA - Microscopic Only, POCT urinalysis dipstick, Urine culture, nitrofurantoin, macrocrystal-monohydrate, (MACROBID) 100 MG capsule  Routine screening for STI (sexually transmitted infection) - Plan: GC/Chlamydia Probe Amp  Unprotected sexual intercourse - Plan: POCT urine pregnancy  Immunization due - Plan: HPV 9-valent vaccine,Recombinat, CANCELED: HPV vaccine quadravalent 3 dose IM  Possible UTI- urine very dilute today, await her culture.  Start macrobid for now. Repeat uriprobe Negative hCG-a little late for plan B at this time but she can use if she likes.  Reminded that this is available for use OTC and encouraged her to keep on hand in case of any other incidence of unprotected sex.   Will plan further follow- up pending labs.    Signed Lamar Blinks, MD

## 2015-03-30 NOTE — Patient Instructions (Signed)
Good to see you today!   I will be in touch with the rest of your labs Use the macrobid twice a day for a week for UTI Remember that "plan B" is available OTC in case of unprotected intercourse.   If you do want to try another method of contraception let me know- condoms are great but be sure to use them all the time! You got your first Gardasil vaccine today- you need 2 more

## 2015-03-31 LAB — URINE CULTURE
Colony Count: NO GROWTH
ORGANISM ID, BACTERIA: NO GROWTH

## 2015-04-01 LAB — GC/CHLAMYDIA PROBE AMP
CT Probe RNA: NEGATIVE
GC Probe RNA: NEGATIVE

## 2015-04-03 ENCOUNTER — Telehealth: Payer: Self-pay

## 2015-04-03 ENCOUNTER — Encounter: Payer: Self-pay | Admitting: Family Medicine

## 2015-04-03 NOTE — Telephone Encounter (Signed)
Pt called about labs. Let her know urine culture and GC/Chlamydia were negative.

## 2015-04-03 NOTE — Telephone Encounter (Signed)
Called her back- asked her to let me know if any sx persist as her labs were negative

## 2015-04-14 ENCOUNTER — Ambulatory Visit (INDEPENDENT_AMBULATORY_CARE_PROVIDER_SITE_OTHER): Payer: BLUE CROSS/BLUE SHIELD | Admitting: Family Medicine

## 2015-04-14 VITALS — BP 122/80 | HR 75 | Temp 97.9°F | Resp 16 | Ht 66.0 in | Wt 141.2 lb

## 2015-04-14 DIAGNOSIS — N898 Other specified noninflammatory disorders of vagina: Secondary | ICD-10-CM | POA: Diagnosis not present

## 2015-04-14 NOTE — Patient Instructions (Signed)
I will be in touch with your labs asap Take care!

## 2015-04-14 NOTE — Progress Notes (Signed)
[facility] [address], [place] [address] [phone]  Date:  04/14/2015   Name:  Dana Perry   DOB:  30-Jan-1991   MRN:  [id number]  PCP:  Pcp Not In System    Chief Complaint: Follow-up   History of Present Illness:  Dana Perry is a 24 y.o. very pleasant female patient who presents with the following:  Here today to recheck- she was seen here on 7/25 with concern about STI and also UTI sx.  STI labs were normal and her urine culture was negative.  She no longer notes any urinary sx, but she notes there her vulva feels hot and irritated.  She does not note any discharge.  She went to another clinic and was treated with diflucan yesterday.   She is here today because she is worried about HSV.  She does not have any sores, but was just worried.  Admits that she tends to worry a lot of STI   She did have puffy lips once, and has had a few cold sores- has been treated with valtrex in the past.  However she has never tested positive for HSV 1 or 2  LMP 7/25  Patient Active Problem List   Diagnosis Date Noted  . Generalized anxiety disorder 03/10/2015  . Depression 03/10/2015  . Yeast infection   . Mitral valve prolapse     Past Medical History  Diagnosis Date  . Yeast infection   . Mitral valve prolapse   . Anxiety   . Allergy   . Depression     Past Surgical History  Procedure Laterality Date  . Tonsillectomy    . Appendectomy      History  Substance Use Topics  . Smoking status: Former Research scientist (life sciences)  . Smokeless tobacco: Never Used  . Alcohol Use: 1.8 oz/week    3 Standard drinks or equivalent per week    Family History  Problem Relation Age of Onset  . Mitral valve prolapse Maternal Grandmother   . Diabetes Father   . Diabetes Mother     No Known Allergies  Medication list has been reviewed and updated.  Current Outpatient Prescriptions on File Prior to Visit  Medication Sig Dispense Refill  . ALPRAZolam (XANAX) 0.5 MG  tablet When necessary panic attack 15 tablet 0  . escitalopram (LEXAPRO) 20 MG tablet Take 40 mg by mouth every evening.    . loratadine (CLARITIN) 10 MG tablet Take 10 mg by mouth daily.    . nitrofurantoin, macrocrystal-monohydrate, (MACROBID) 100 MG capsule Take 1 capsule (100 mg total) by mouth 2 (two) times daily. (Patient not taking: Reported on 04/14/2015) 14 capsule 0   No current facility-administered medications on file prior to visit.    Review of Systems:  As per HPI- otherwise negative.   Physical Examination: Filed Vitals:   04/14/15 1647  BP: 122/80  Pulse: 75  Temp: 97.9 F (36.6 C)  Resp: 16   Filed Vitals:   04/14/15 1647  Height: 5\' 6"  (1.676 m)  Weight: 141 lb 3.2 oz (64.048 kg)   Body mass index is 22.8 kg/(m^2). Ideal Body Weight: Weight in (lb) to have BMI = 25: 154.6  GEN: WDWN, NAD, Non-toxic, A & O x 3, looks well HEENT: Atraumatic, Normocephalic. Neck supple. No masses, No LAD. Ears and Nose: No external deformity. CV: RRR, No M/G/R. No JVD. No thrill. No extra heart sounds. PULM: CTA B, no wheezes, crackles, rhonchi. No retractions. No  resp. distress. No accessory muscle use. ABD: S, NT, ND. No rebound. No HSM. EXTR: No c/c/e NEURO Normal gait.  PSYCH: Normally interactive. Conversant. Not depressed or anxious appearing.  Calm demeanor.  Pelvic: normal, no vaginal lesions or discharge. Uterus normal, no CMT, no adnexal tendereness or masses Minimal inflammation of vulva c/w resolving minolia  Assessment and Plan: Vaginal irritation - Plan: HSV(herpes simplex vrs) 1+2 ab-IgG  Reassured that I do not see any sign of HSV.  She would like to be have an antibody titer which is fine.  Will be in touch with her pending these results   Signed Lamar Blinks, MD

## 2015-04-16 LAB — HSV(HERPES SIMPLEX VRS) I + II AB-IGG
HSV 1 Glycoprotein G Ab, IgG: <0.1 IV
HSV 2 Glycoprotein G Ab, IgG: 0.13 IV

## 2015-06-02 ENCOUNTER — Ambulatory Visit (INDEPENDENT_AMBULATORY_CARE_PROVIDER_SITE_OTHER): Payer: BLUE CROSS/BLUE SHIELD | Admitting: Family Medicine

## 2015-06-02 VITALS — BP 116/70 | HR 84 | Temp 98.4°F | Resp 18 | Ht 66.5 in | Wt 146.0 lb

## 2015-06-02 DIAGNOSIS — J039 Acute tonsillitis, unspecified: Secondary | ICD-10-CM

## 2015-06-02 DIAGNOSIS — R05 Cough: Secondary | ICD-10-CM | POA: Diagnosis not present

## 2015-06-02 DIAGNOSIS — J029 Acute pharyngitis, unspecified: Secondary | ICD-10-CM | POA: Diagnosis not present

## 2015-06-02 DIAGNOSIS — J069 Acute upper respiratory infection, unspecified: Secondary | ICD-10-CM

## 2015-06-02 DIAGNOSIS — R059 Cough, unspecified: Secondary | ICD-10-CM

## 2015-06-02 LAB — POCT RAPID STREP A (OFFICE): Rapid Strep A Screen: NEGATIVE

## 2015-06-02 MED ORDER — HYDROCODONE-HOMATROPINE 5-1.5 MG/5ML PO SYRP: 5.0000 mL | ORAL_SOLUTION | Freq: Every evening | ORAL | Status: DC | PRN

## 2015-06-02 MED ORDER — AZITHROMYCIN 250 MG PO TABS: ORAL_TABLET | ORAL | Status: DC

## 2015-06-02 MED ORDER — TRIAMCINOLONE ACETONIDE 0.1 % EX CREA: 1.0000 "application " | TOPICAL_CREAM | Freq: Two times a day (BID) | CUTANEOUS | Status: DC

## 2015-06-02 NOTE — Progress Notes (Signed)
Chief Complaint:  Chief Complaint  Patient presents with  . Sore Throat    x 1 week   . URI  . Rash    finger     HPI: Dana Perry is a 24 y.o. female who reports to [facility] today complaining of: 5 day history of sore throat , no fevers or chills but has had laryngitis. Works in [place] Eczema on her hands, washing dishes and working in [employer], she states it is only on some fingers, tried otc hydrocortisone She has allergies, cats and dogs and cockroaches and mold. They have pets.  She has been taking her allergy meds without relief.  No fevers or chills, no SOB or wheezing or chills  Past Medical History  Diagnosis Date  . Yeast infection   . Mitral valve prolapse   . Anxiety   . Allergy   . Depression    Past Surgical History  Procedure Laterality Date  . Tonsillectomy    . Appendectomy     Social History   Social History  . Marital Status: Single    Spouse Name: N/A  . Number of Children: N/A  . Years of Education: N/A   Social History Main Topics  . Smoking status: Former Research scientist (life sciences)  . Smokeless tobacco: Never Used  . Alcohol Use: 1.8 oz/week    3 Standard drinks or equivalent per week  . Drug Use: Yes    Special: Marijuana     Comment: has smoked weed in the past   . Sexual Activity:    Partners: Male    Birth Control/ Protection: Condom   Other Topics Concern  . None   Social History Narrative   Family History  Problem Relation Age of Onset  . Mitral valve prolapse Maternal Grandmother   . Diabetes Father   . Diabetes Mother    No Known Allergies Prior to Admission medications   Medication Sig Start Date End Date Taking? Authorizing Provider  ALPRAZolam Duanne Moron) 0.5 MG tablet When necessary panic attack 03/10/15  Yes Leandrew Koyanagi, MD  escitalopram (LEXAPRO) 20 MG tablet Take 40 mg by mouth every evening.   Yes Historical Provider, MD  loratadine (CLARITIN) 10 MG tablet Take 10 mg by mouth daily.   Yes Historical  Provider, MD  nitrofurantoin, macrocrystal-monohydrate, (MACROBID) 100 MG capsule Take 1 capsule (100 mg total) by mouth 2 (two) times daily. 03/30/15  Yes Gay Filler Copland, MD     ROS: The patient denies fevers, chills, night sweats, unintentional weight loss, chest pain, palpitations, wheezing, dyspnea on exertion, nausea, vomiting, abdominal pain, dysuria, hematuria, melena, numbness, weakness, or tingling.   All other systems have been reviewed and were otherwise negative with the exception of those mentioned in the HPI and as above.    PHYSICAL EXAM: Filed Vitals:   06/02/15 1720  BP: 116/70  Pulse: 84  Temp: 98.4 F (36.9 C)  Resp: 18   Body mass index is 23.21 kg/(m^2).   General: Alert, no acute distress HEENT:  Normocephalic, atraumatic, oropharynx patent. EOMI, PERRLA Erythematous throat, no exudates, TM normal, + sinus tenderness, + erythematous/boggy nasal mucosa Cardiovascular:  Regular rate and rhythm, no rubs murmurs or gallops.  No Carotid bruits, radial pulse intact. No pedal edema.  Respiratory: Clear to auscultation bilaterally.  No wheezes, rales, or rhonchi.  No cyanosis, no use of accessory musculature Abdominal: No organomegaly, abdomen is soft and non-tender, positive bowel sounds. No masses. Skin: + eczema  rash on hand Neurologic: Facial musculature symmetric. Psychiatric: Patient acts appropriately throughout our interaction. Lymphatic: No cervical or submandibular lymphadenopathy Musculoskeletal: Gait intact. No edema, tenderness   LABS: Results for orders placed or performed in visit on 06/02/15  POCT rapid strep A  Result Value Ref Range   Rapid Strep A Screen Negative Negative     EKG/XRAY:   Primary read interpreted by Dr. Marin Comment at [facility].   ASSESSMENT/PLAN: Encounter Diagnoses  Name Primary?  . Acute pharyngitis, unspecified pharyngitis type Yes  . Acute upper respiratory infection   . Acute tonsillitis   . Cough     Azithromycin Hycodan Triamcinolone cream   Gross sideeffects, risk and benefits, and alternatives of medications d/w patient. Patient is aware that all medications have potential sideeffects and we are unable to predict every sideeffect or drug-drug interaction that may occur.  Thao Le DO  06/05/2015 8:04 AM

## 2015-06-04 ENCOUNTER — Telehealth: Payer: Self-pay

## 2015-06-04 NOTE — Telephone Encounter (Signed)
Pharm sent req for clarification of sig on Zithromax. Dr Marin Comment, could you please resend after making the sig clearer (I think they are confused by "BOW"), and I could not find directions spelled out in [facility] notes to advise them. Thank you.

## 2015-06-07 NOTE — Telephone Encounter (Signed)
Called into pharmcy and corrected info 06/04/15

## 2015-07-09 ENCOUNTER — Ambulatory Visit (INDEPENDENT_AMBULATORY_CARE_PROVIDER_SITE_OTHER): Payer: BLUE CROSS/BLUE SHIELD | Admitting: Emergency Medicine

## 2015-07-09 VITALS — BP 114/82 | HR 88 | Temp 97.7°F | Resp 16 | Ht 66.5 in | Wt 151.0 lb

## 2015-07-09 DIAGNOSIS — Z202 Contact with and (suspected) exposure to infections with a predominantly sexual mode of transmission: Secondary | ICD-10-CM

## 2015-07-09 DIAGNOSIS — J0140 Acute pansinusitis, unspecified: Secondary | ICD-10-CM | POA: Diagnosis not present

## 2015-07-09 DIAGNOSIS — J302 Other seasonal allergic rhinitis: Secondary | ICD-10-CM

## 2015-07-09 DIAGNOSIS — Z3200 Encounter for pregnancy test, result unknown: Secondary | ICD-10-CM | POA: Diagnosis not present

## 2015-07-09 LAB — POCT URINE PREGNANCY: Preg Test, Ur: NEGATIVE

## 2015-07-09 MED ORDER — HYDROCOD POLST-CPM POLST ER 10-8 MG/5ML PO SUER: 5.0000 mL | Freq: Two times a day (BID) | ORAL | Status: DC

## 2015-07-09 MED ORDER — AMOXICILLIN-POT CLAVULANATE 875-125 MG PO TABS: 1.0000 | ORAL_TABLET | Freq: Two times a day (BID) | ORAL | Status: DC

## 2015-07-09 MED ORDER — PSEUDOEPHEDRINE-GUAIFENESIN ER 60-600 MG PO TB12: 1.0000 | ORAL_TABLET | Freq: Two times a day (BID) | ORAL | Status: DC

## 2015-07-09 MED ORDER — TRIAMCINOLONE ACETONIDE 55 MCG/ACT NA AERO: 2.0000 | INHALATION_SPRAY | Freq: Every day | NASAL | Status: DC

## 2015-07-09 NOTE — Patient Instructions (Signed)

## 2015-07-09 NOTE — Progress Notes (Signed)
Subjective:  Patient ID: Dana Perry, female    DOB: 11-17-1990  Age: 24 y.o. MRN: [id number]  CC: STD testing and nasal drainage   HPI Dana Perry presents  numerous complaints. She has nasal congestion postnasal drainage that is mucopurulent color. She's been symptomatic for at least a month. Has been taking Claritin and Zyrtec with no improvement. She has no fever or chills. She has a cough that's productive of mucopurulent sputum. She has no wheezing or shortness of breath. She has no nausea vomiting or stool change.    Entered into a sexual relationship with a new partner and wishes to get tested for STDs. She is using condoms for contraceptives. She's been on oral contraceptives in the past and does want to use them now.    History Dana Perry has a past medical history of Yeast infection; Mitral valve prolapse; Anxiety; Allergy; and Depression.   She has past surgical history that includes Tonsillectomy and Appendectomy.   Her  family history includes Diabetes in her father and mother; Mitral valve prolapse in her maternal grandmother.  She   reports that she has quit smoking. She has never used smokeless tobacco. She reports that she drinks about 1.8 oz of alcohol per week. She reports that she uses illicit drugs (Marijuana).  Outpatient Prescriptions Prior to Visit  Medication Sig Dispense Refill  . ALPRAZolam (XANAX) 0.5 MG tablet When necessary panic attack 15 tablet 0  . escitalopram (LEXAPRO) 20 MG tablet Take 40 mg by mouth every evening.    . loratadine (CLARITIN) 10 MG tablet Take 10 mg by mouth daily.    Marland Kitchen triamcinolone cream (KENALOG) 0.1 % Apply 1 application topically 2 (two) times daily. 30 g 0  . azithromycin (ZITHROMAX) 250 MG tablet Take 2 tabs po bow then 1 tab po daily. (Patient not taking: Reported on 07/09/2015) 7 tablet 0  . HYDROcodone-homatropine (HYCODAN) 5-1.5 MG/5ML syrup Take 5 mLs by mouth at bedtime as needed. (Patient not taking: Reported on  07/09/2015) 100 mL 0   No facility-administered medications prior to visit.    Social History   Social History  . Marital Status: Single    Spouse Name: N/A  . Number of Children: N/A  . Years of Education: N/A   Social History Main Topics  . Smoking status: Former Research scientist (life sciences)  . Smokeless tobacco: Never Used  . Alcohol Use: 1.8 oz/week    3 Standard drinks or equivalent per week  . Drug Use: Yes    Special: Marijuana     Comment: has smoked weed in the past   . Sexual Activity:    Partners: Male    Birth Control/ Protection: Condom   Other Topics Concern  . None   Social History Narrative     Review of Systems  Constitutional: Negative for fever, chills and appetite change.  HENT: Positive for congestion, postnasal drip, rhinorrhea and sinus pressure. Negative for ear pain and sore throat.   Eyes: Negative for pain and redness.  Respiratory: Positive for cough. Negative for shortness of breath and wheezing.   Cardiovascular: Negative for leg swelling.  Gastrointestinal: Negative for nausea, vomiting, abdominal pain, diarrhea, constipation and blood in stool.  Endocrine: Negative for polyuria.  Genitourinary: Negative for dysuria, urgency, frequency and flank pain.  Musculoskeletal: Negative for gait problem.  Skin: Negative for rash.  Neurological: Negative for weakness and headaches.  Psychiatric/Behavioral: Negative for confusion and decreased concentration. The patient is not nervous/anxious.     Objective:  BP 114/82 mmHg  Pulse 88  Temp(Src) 97.7 F (36.5 C) (Oral)  Resp 16  Ht 5' 6.5" (1.689 m)  Wt 151 lb (68.493 kg)  BMI 24.01 kg/m2  SpO2 99%  LMP 06/09/2015  Physical Exam  Constitutional: She is oriented to person, place, and time. She appears well-developed and well-nourished. No distress.  HENT:  Head: Normocephalic and atraumatic.  Right Ear: External ear normal.  Left Ear: External ear normal.  Nose: Nose normal.  Eyes: Conjunctivae and EOM are  normal. Pupils are equal, round, and reactive to light. No scleral icterus.  Neck: Normal range of motion. Neck supple. No tracheal deviation present.  Cardiovascular: Normal rate, regular rhythm and normal heart sounds.   Pulmonary/Chest: Effort normal. No respiratory distress. She has no wheezes. She has no rales.  Abdominal: She exhibits no mass. There is no tenderness. There is no rebound and no guarding.  Musculoskeletal: She exhibits no edema.  Lymphadenopathy:    She has no cervical adenopathy.  Neurological: She is alert and oriented to person, place, and time. Coordination normal.  Skin: Skin is warm and dry. No rash noted.  Psychiatric: She has a normal mood and affect. Her behavior is normal.      Assessment & Plan:   Dana Perry was seen today for std testing and nasal drainage.  Diagnoses and all orders for this visit:  STD exposure -     GC/Chlamydia Probe Amp -     HSV(herpes simplex vrs) 1+2 ab-IgG -     HIV antibody -     RPR  Possible pregnancy -     POCT urine pregnancy -     GC/Chlamydia Probe Amp -     HSV(herpes simplex vrs) 1+2 ab-IgG -     HIV antibody -     RPR  Acute pansinusitis, recurrence not specified  Seasonal allergic rhinitis  Other orders -     triamcinolone (NASACORT AQ) 55 MCG/ACT AERO nasal inhaler; Place 2 sprays into the nose daily. -     amoxicillin-clavulanate (AUGMENTIN) 875-125 MG tablet; Take 1 tablet by mouth 2 (two) times daily. -     pseudoephedrine-guaifenesin (MUCINEX D) 60-600 MG 12 hr tablet; Take 1 tablet by mouth every 12 (twelve) hours. -     chlorpheniramine-HYDROcodone (TUSSIONEX PENNKINETIC ER) 10-8 MG/5ML SUER; Take 5 mLs by mouth 2 (two) times daily.   I am having Dana Perry start on triamcinolone, amoxicillin-clavulanate, pseudoephedrine-guaifenesin, and chlorpheniramine-HYDROcodone. I am also having her maintain her escitalopram, ALPRAZolam, loratadine, azithromycin, triamcinolone cream, and  HYDROcodone-homatropine.  Meds ordered this encounter  Medications  . triamcinolone (NASACORT AQ) 55 MCG/ACT AERO nasal inhaler    Sig: Place 2 sprays into the nose daily.    Dispense:  1 Inhaler    Refill:  12  . amoxicillin-clavulanate (AUGMENTIN) 875-125 MG tablet    Sig: Take 1 tablet by mouth 2 (two) times daily.    Dispense:  20 tablet    Refill:  0  . pseudoephedrine-guaifenesin (MUCINEX D) 60-600 MG 12 hr tablet    Sig: Take 1 tablet by mouth every 12 (twelve) hours.    Dispense:  18 tablet    Refill:  0  . chlorpheniramine-HYDROcodone (TUSSIONEX PENNKINETIC ER) 10-8 MG/5ML SUER    Sig: Take 5 mLs by mouth 2 (two) times daily.    Dispense:  60 mL    Refill:  0    Appropriate red flag conditions were discussed with the patient as well as actions that should  be taken.  Patient expressed his understanding.  Follow-up: Return if symptoms worsen or fail to improve.  Roselee Culver, MD   Results for orders placed or performed in visit on 07/09/15  POCT urine pregnancy  Result Value Ref Range   Preg Test, Ur Negative Negative

## 2015-07-10 LAB — RPR

## 2015-07-10 LAB — HSV(HERPES SIMPLEX VRS) I + II AB-IGG
HSV 1 Glycoprotein G Ab, IgG: <0.1 IV
HSV 2 Glycoprotein G Ab, IgG: 0.1 IV

## 2015-07-10 LAB — HIV ANTIBODY (ROUTINE TESTING W REFLEX): HIV: NONREACTIVE

## 2015-07-11 LAB — GC/CHLAMYDIA PROBE AMP
CT PROBE, AMP APTIMA: NEGATIVE
GC Probe RNA: NEGATIVE

## 2015-08-03 ENCOUNTER — Ambulatory Visit (INDEPENDENT_AMBULATORY_CARE_PROVIDER_SITE_OTHER): Payer: BLUE CROSS/BLUE SHIELD | Admitting: Family Medicine

## 2015-08-03 DIAGNOSIS — J3081 Allergic rhinitis due to animal (cat) (dog) hair and dander: Secondary | ICD-10-CM

## 2015-08-03 DIAGNOSIS — J029 Acute pharyngitis, unspecified: Secondary | ICD-10-CM

## 2015-08-03 DIAGNOSIS — R059 Cough, unspecified: Secondary | ICD-10-CM

## 2015-08-03 DIAGNOSIS — R05 Cough: Secondary | ICD-10-CM | POA: Diagnosis not present

## 2015-08-03 LAB — POCT RAPID STREP A (OFFICE): RAPID STREP A SCREEN: NEGATIVE

## 2015-08-03 MED ORDER — AZITHROMYCIN 250 MG PO TABS: ORAL_TABLET | ORAL | Status: DC

## 2015-08-03 NOTE — Progress Notes (Signed)
Patient ID: Dana Perry, female    DOB: Dec 28, 1990  Age: 24 y.o. MRN: [id number]  Chief Complaint  Patient presents with  . Nasal Congestion     x 1 month  . Ear Pain  . Sinusitis    white spots on tonsills    Subjective:   24 year old lady who is here because of respiratory problems and a pharyngitis. She has had a couple of days of a sore throat and noticed white mucus collection on her tonsils. She has not noticed any swollen glands in her neck. She has not been feverish. She has has a long history of allergy problems to animal dander, especially dogs but also cats. She has regular exposure both. She has a regular boyfriend who is non-ill. No history of STDs between them. She has had oral sex contact. She works a job at [employer]. She has a chronic cough yellow clear mucus production. She does not smoke. She is on medications for several things including allergies and nerves,   Current allergies, medications, problem list, past/family and social histories reviewed.  Objective:  LMP 06/09/2015  No major acute distress. Pleasant young lady. TMs normal. Throat is not as red as it is covered with pus. Tonsils have a whitish gray exudate coating both tonsils. Strep test and culture were taken. Neck supple without major nodes. Chest is clear to auscultation. Heart regular without murmur.  Results for orders placed or performed in visit on 08/03/15  POCT rapid strep A  Result Value Ref Range   Rapid Strep A Screen Negative Negative       Assessment & Plan:   Assessment: 1. Acute pharyngitis, unspecified etiology   2. Allergic rhinitis due to animal hair and dander   3. Cough       Plan: Strep test. If it is negative will send off the culture, and will also do a GC culture.  Orders Placed This Encounter  Procedures  . Culture, Group A Strep  . Gonococcus culture  . POCT rapid strep A    Meds ordered this encounter  Medications  . DISCONTD: azithromycin  (ZITHROMAX) 250 MG tablet    Sig: Take 2 tabs PO x 1 dose, then 1 tab PO QD x 4 days    Dispense:  6 tablet    Refill:  0  . azithromycin (ZITHROMAX) 250 MG tablet    Sig: Take 2 tabs PO x 1 dose, then 1 tab PO QD x 4 days    Dispense:  6 tablet    Refill:  0         Patient Instructions  Drink plenty of fluids and get enough rest  Take caution to avoid kissing and other oral contact until after your throat clears up  Use cough syrup as needed  Take the azithromycin 2 pills today, then 1 daily for 4 days  Return at anytime if worse     Return if symptoms worsen or fail to improve.   Samual Beals, MD 08/03/2015

## 2015-08-03 NOTE — Patient Instructions (Signed)
Drink plenty of fluids and get enough rest  Take caution to avoid kissing and other oral contact until after your throat clears up  Use cough syrup as needed  Take the azithromycin 2 pills today, then 1 daily for 4 days  Return at anytime if worse

## 2015-08-05 LAB — CULTURE, GROUP A STREP: ORGANISM ID, BACTERIA: NORMAL

## 2015-08-06 LAB — GONOCOCCUS CULTURE

## 2015-12-24 ENCOUNTER — Ambulatory Visit (INDEPENDENT_AMBULATORY_CARE_PROVIDER_SITE_OTHER): Payer: BLUE CROSS/BLUE SHIELD | Admitting: Urgent Care

## 2015-12-24 VITALS — BP 102/64 | HR 83 | Temp 97.9°F | Resp 16 | Ht 67.0 in | Wt 172.0 lb

## 2015-12-24 DIAGNOSIS — J029 Acute pharyngitis, unspecified: Secondary | ICD-10-CM

## 2015-12-24 DIAGNOSIS — R5383 Other fatigue: Secondary | ICD-10-CM | POA: Diagnosis not present

## 2015-12-24 DIAGNOSIS — R6883 Chills (without fever): Secondary | ICD-10-CM

## 2015-12-24 DIAGNOSIS — R52 Pain, unspecified: Secondary | ICD-10-CM

## 2015-12-24 DIAGNOSIS — K120 Recurrent oral aphthae: Secondary | ICD-10-CM

## 2015-12-24 DIAGNOSIS — R21 Rash and other nonspecific skin eruption: Secondary | ICD-10-CM

## 2015-12-24 LAB — POCT SKIN KOH: Skin KOH, POC: NEGATIVE

## 2015-12-24 MED ORDER — HYDROCODONE-HOMATROPINE 5-1.5 MG/5ML PO SYRP: 5.0000 mL | ORAL_SOLUTION | Freq: Every evening | ORAL | Status: DC | PRN

## 2015-12-24 MED ORDER — FLUCONAZOLE 150 MG PO TABS: 150.0000 mg | ORAL_TABLET | Freq: Once | ORAL | Status: DC

## 2015-12-24 MED ORDER — BENZONATATE 100 MG PO CAPS: 100.0000 mg | ORAL_CAPSULE | Freq: Three times a day (TID) | ORAL | Status: DC | PRN

## 2015-12-24 MED ORDER — LIDOCAINE VISCOUS 2 % MT SOLN: OROMUCOSAL | Status: DC

## 2015-12-24 NOTE — Patient Instructions (Addendum)
Sore Throat A sore throat is pain, burning, irritation, or scratchiness of the throat. There is often pain or tenderness when swallowing or talking. A sore throat may be accompanied by other symptoms, such as coughing, sneezing, fever, and swollen neck glands. A sore throat is often the first sign of another sickness, such as a cold, flu, strep throat, or mononucleosis (commonly known as mono). Most sore throats go away without medical treatment. CAUSES  The most common causes of a sore throat include:  A viral infection, such as a cold, flu, or mono.  A bacterial infection, such as strep throat, tonsillitis, or whooping cough.  Seasonal allergies.  Dryness in the air.  Irritants, such as smoke or pollution.  Gastroesophageal reflux disease (GERD). HOME CARE INSTRUCTIONS   Only take over-the-counter medicines as directed by your caregiver.  Drink enough fluids to keep your urine clear or pale yellow.  Rest as needed.  Try using throat sprays, lozenges, or sucking on hard candy to ease any pain (if older than 4 years or as directed).  Sip warm liquids, such as broth, herbal tea, or warm water with honey to relieve pain temporarily. You may also eat or drink cold or frozen liquids such as frozen ice pops.  Gargle with salt water (mix 1 tsp salt with 8 oz of water).  Do not smoke and avoid secondhand smoke.  Put a cool-mist humidifier in your bedroom at night to moisten the air. You can also turn on a hot shower and sit in the bathroom with the door closed for 5-10 minutes. SEEK IMMEDIATE MEDICAL CARE IF:  You have difficulty breathing.  You are unable to swallow fluids, soft foods, or your saliva.  You have increased swelling in the throat.  Your sore throat does not get better in 7 days.  You have nausea and vomiting.  You have a fever or persistent symptoms for more than 2-3 days.  You have a fever and your symptoms suddenly get worse. MAKE SURE YOU:   Understand  these instructions.  Will watch your condition.  Will get help right away if you are not doing well or get worse.   This information is not intended to replace advice given to you by your health care provider. Make sure you discuss any questions you have with your health care provider.   Document Released: 09/29/2004 Document Revised: 09/12/2014 Document Reviewed: 04/29/2012 Elsevier Interactive Patient Education 2016 [place] Canker sores are small, painful sores that develop inside your mouth. They may also be called aphthous ulcers. You can get canker sores on the inside of your lips or cheeks, on your tongue, or anywhere inside your mouth. You can have just one canker sore or several of them. Canker sores cannot be passed from one person to another (noncontagious). These sores are different than the sores that you may get on the outside of your lips (cold sores or fever blisters). Canker sores usually start as painful red bumps. Then they turn into small white, yellow, or gray ulcers that have red borders. The ulcers may be quite painful. The pain may be worse when you eat or drink. CAUSES The cause of this condition is not known. RISK FACTORS This condition is more likely to develop in:  Women.  People in their teens or 77s.  Women who are having their menstrual period.  People who are under a lot of emotional stress.  People who do not get enough iron or  B vitamins.  People who have poor oral hygiene.  People who have an injury inside the mouth. This can happen after having dental work or from chewing something hard. SYMPTOMS Along with the canker sore, symptoms may also include:  Fever.  Fatigue.  Swollen lymph nodes in your neck. DIAGNOSIS This condition can be diagnosed based on your symptoms. Your health care provider will also examine your mouth. Your health care provider may also do tests if you get canker sores often or if they are very  bad. Tests may include:  Blood tests to rule out other causes of canker sores.  Taking swabs from the sore to check for infection.  Taking a small piece of skin from the sore (biopsy) to test it for cancer. TREATMENT Most canker sores clear up without treatment in about 10 days. Home care is usually the only treatment that you will need. Over-the-counter medicines can relieve discomfort.If you have severe canker sores, your health care provider may prescribe:  Numbing ointment to relieve pain.  Vitamins.  Steroid medicines. These may be given as:  Oral pills.  Mouth rinses.  Gels.  Antibiotic mouth rinse. HOME CARE INSTRUCTIONS  Apply, take, or use medicines only as directed by your health care provider. These include vitamins.  If you were prescribed an antibiotic mouth rinse, finish all of it even if you start to feel better.  Until the sores are healed:  Do not drink coffee or citrus juices.  Do not eat spicy or salty foods.  Use a mild, over-the-counter mouth rinse as directed by your health care provider.  Practice good oral hygiene.  Floss your teeth every day.  Brush your teeth with a soft brush twice each day. SEEK MEDICAL CARE IF:  Your symptoms do not get better after two weeks.  You also have a fever or swollen glands.  You get canker sores often.  You have a canker sore that is getting larger.  You cannot eat or drink due to your canker sores.   This information is not intended to replace advice given to you by your health care provider. Make sure you discuss any questions you have with your health care provider.   Document Released: 12/17/2010 Document Revised: 01/06/2015 Document Reviewed: 07/23/2014 Elsevier Interactive Patient Education 2016 [employer].     IF you received an x-ray today, you will receive an invoice from [facility]. Please contact [facility] at [phone] with questions or concerns regarding your  invoice.   IF you received labwork today, you will receive an invoice from [employer]. Please contact [facility] at [phone] with questions or concerns regarding your invoice.   Our billing staff will not be able to assist you with questions regarding bills from these companies.  You will be contacted with the lab results as soon as they are available. The fastest way to get your results is to activate your My Chart account. Instructions are located on the last page of this paperwork. If you have not heard from [place] regarding the results in 2 weeks, please contact this office.

## 2015-12-24 NOTE — Progress Notes (Signed)
    MRN: [id number] DOB: [birth year]  Subjective:   Dana Perry is a 25 y.o. female presenting for chief complaint of Chills; Fatigue; Generalized Body Aches; Sore Throat; Lip Sore; and Rash  Reports 2 day history of sore throat, chills, fatigue, body aches, sore over her lower lip, subjective fever, mild cough. She has been using DayQuil and APAP with minimal relief. Admits history of allergies to mold, cats and dogs, roaches. She lives with cats. Takes Claritin with good relief of her allergies.   Rash - Reports that she gets a fungal rash over her chest, forearms. She has a longstanding history of this. The rash is not itchy, painful, no blisters, generally does not bother her except for the discolored appearance. It has usually resolved using anti-fungal agents.  Leafy has a current medication list which includes the following prescription(s): alprazolam, escitalopram, loratadine, and triamcinolone. Also has No Known Allergies.  [name]  has a past medical history of Yeast infection; Mitral valve prolapse; Anxiety; Allergy; and Depression. Also  has past surgical history that includes Tonsillectomy and Appendectomy.  Objective:   Vitals: BP 102/64 mmHg  Pulse 83  Temp(Src) 97.9 F (36.6 C)  Resp 16  Ht 5\' 7"  (1.702 m)  Wt 172 lb (78.019 kg)  BMI 26.93 kg/m2  SpO2 98%  LMP 12/12/2015  Physical Exam  Constitutional: She is oriented to person, place, and time. She appears well-developed and well-nourished.  HENT:  TM's flat bilaterally, no effusions or erythema. Nasal turbinates boggy and edematous. No sinus tenderness. Postnasal drip present and tonsils slightly enlarged but without exudates, erythema or abscesses.  Eyes: Right eye exhibits no discharge. Left eye exhibits no discharge. No scleral icterus.  Neck: Normal range of motion. Neck supple.  Cardiovascular: Normal rate, regular rhythm and intact distal pulses.   Pulmonary/Chest: No respiratory distress. She has no  wheezes. She has no rales.  Musculoskeletal: She exhibits no edema.  Lymphadenopathy:    She has no cervical adenopathy.  Neurological: She is alert and oriented to person, place, and time.  Skin: Skin is warm and dry.   Results for orders placed or performed in visit on 12/24/15 (from the past 24 hour(s))  POCT Skin KOH     Status: None   Collection Time: 12/24/15  3:54 PM  Result Value Ref Range   Skin KOH, POC Negative    Assessment and Plan :   1. Sore throat 2. Body aches 3. Chills 4. Other fatigue - Likely undergoing viral URI. Patient declined strep swab, flu swab. I recommended supportive care, use Hycodan and Tessalon as needed for sore throat, cough. RTC in 1 week if no improvement.  5. Rash and nonspecific skin eruption - Start diflucan for what I suspect is a fungal infection.  6. Aphthous ulcer of mouth - Counseled on diagnosis. Use viscous lidocaine as needed.  Jaynee Eagles, PA-C [facility] [phone] 12/24/2015 3:30 PM

## 2016-02-17 ENCOUNTER — Ambulatory Visit (INDEPENDENT_AMBULATORY_CARE_PROVIDER_SITE_OTHER): Payer: BLUE CROSS/BLUE SHIELD | Admitting: Internal Medicine

## 2016-02-17 VITALS — BP 108/68 | HR 85 | Temp 98.0°F | Resp 18

## 2016-02-17 DIAGNOSIS — N926 Irregular menstruation, unspecified: Secondary | ICD-10-CM | POA: Diagnosis not present

## 2016-02-17 DIAGNOSIS — Z3201 Encounter for pregnancy test, result positive: Secondary | ICD-10-CM | POA: Diagnosis not present

## 2016-02-17 LAB — POCT URINE PREGNANCY: Preg Test, Ur: POSITIVE — AB

## 2016-02-17 NOTE — Patient Instructions (Addendum)
     IF you received an x-ray today, you will receive an invoice from [facility]. Please contact [facility] at [phone] with questions or concerns regarding your invoice.   IF you received labwork today, you will receive an invoice from [employer]. Please contact [facility] at [phone] with questions or concerns regarding your invoice.   Our billing staff will not be able to assist you with questions regarding bills from these companies.  You will be contacted with the lab results as soon as they are available. The fastest way to get your results is to activate your My Chart account. Instructions are located on the last page of this paperwork. If you have not heard from [place] regarding the results in 2 weeks, please contact this office.     Claim Your Listing  A Preferred [place] [facility] (1)  Print  Share  Categories: Family Planning & Abortion Alternatives ,   Serving in  [place], [place] [address] [phone]

## 2016-02-17 NOTE — Progress Notes (Signed)
Subjective:  By signing my name below, I, Raven Small, attest that this documentation has been prepared under the direction and in the presence of [name], MD.  Electronically Signed: Thea Alken, ED Scribe. 02/17/2016. 5:40 PM.   Patient ID: Dana Perry, female    DOB: 1991/04/11, 25 y.o.   MRN: [id number]  HPI Chief Complaint  Patient presents with  . Possible Pregnancy    HPI Comments: Dana Perry is a 25 y.o. female who presents to the [facility] for possible pregnancy. Pt had 3 positive home pregnancy test. Pt reports symptoms of nausea and urinary frequency. She recalls having unprotected intercourse 5/30. She took Plan B, which she got from [place], 3 days after having intercourse but believes pill was old, possibly expired.  Her next menstrual cycle is supposed to arrive in 4 days. Pt has been with her current partner for 8 month. She is unsure if she wants children at this point in her life and would like to discuss this with her partner first before making a decision.    Patient Active Problem List   Diagnosis Date Noted  . Generalized anxiety disorder 03/10/2015  . Depression 03/10/2015  . Yeast infection   . Mitral valve prolapse    Past Medical History  Diagnosis Date  . Yeast infection   . Mitral valve prolapse   . Anxiety   . Allergy   . Depression    Past Surgical History  Procedure Laterality Date  . Tonsillectomy    . Appendectomy     No Known Allergies Prior to Admission medications   Medication Sig Start Date End Date Taking? Authorizing Provider  ALPRAZolam Duanne Moron) 0.5 MG tablet When necessary panic attack 03/10/15  Yes Leandrew Koyanagi, MD  escitalopram (LEXAPRO) 20 MG tablet Take 40 mg by mouth every evening.   Yes Historical Provider, MD  loratadine (CLARITIN) 10 MG tablet Take 10 mg by mouth daily.   Yes Historical Provider, MD  triamcinolone (NASACORT AQ) 55 MCG/ACT AERO nasal inhaler Place 2 sprays into the nose  daily. 07/09/15  Yes Roselee Culver, MD   Social History   Social History  . Marital Status: Single    Spouse Name: N/A  . Number of Children: N/A  . Years of Education: N/A   Occupational History  . Not on file.   Social History Main Topics  . Smoking status: Former Research scientist (life sciences)  . Smokeless tobacco: Never Used  . Alcohol Use: 1.8 oz/week    3 Standard drinks or equivalent per week  . Drug Use: Yes    Special: Marijuana     Comment: has smoked weed in the past   . Sexual Activity:    Partners: Male    Birth Control/ Protection: Condom   Other Topics Concern  . Not on file   Social History Narrative   Review of Systems  Gastrointestinal: Positive for nausea.  Genitourinary: Positive for frequency.       Objective:   Physical Exam  Constitutional: She is oriented to person, place, and time. She appears well-developed and well-nourished. No distress.  HENT:  Head: Normocephalic and atraumatic.  Eyes: Conjunctivae and EOM are normal.  Neck: Neck supple.  Cardiovascular: Normal rate.   Pulmonary/Chest: Effort normal.  Musculoskeletal: Normal range of motion.  Neurological: She is alert and oriented to person, place, and time.  Skin: Skin is warm and dry.  Psychiatric: She has a normal mood and affect. Her behavior  is normal.  Nursing note and vitals reviewed.   Filed Vitals:   02/17/16 1659  BP: 108/68  Pulse: 85  Temp: 98 F (36.7 C)  TempSrc: Oral  Resp: 18  SpO2: 99%    Results for orders placed or performed in visit on 02/17/16  POCT urine pregnancy  Result Value Ref Range   Preg Test, Ur Positive (A) Negative    Assessment & Plan:  Missed period - Plan: POCT urine pregnancy  Positive pregnancy test  Discussed alternatives  I have completed the patient encounter in its entirety as documented by the scribe, with editing by me where necessary. Rakia Frayne P. Laney Pastor, M.D.

## 2016-02-22 ENCOUNTER — Ambulatory Visit (INDEPENDENT_AMBULATORY_CARE_PROVIDER_SITE_OTHER): Payer: BLUE CROSS/BLUE SHIELD | Admitting: Family Medicine

## 2016-02-22 VITALS — BP 116/70 | HR 90 | Temp 98.5°F | Resp 17 | Ht 67.0 in | Wt 173.4 lb

## 2016-02-22 DIAGNOSIS — Z113 Encounter for screening for infections with a predominantly sexual mode of transmission: Secondary | ICD-10-CM

## 2016-02-22 DIAGNOSIS — Z23 Encounter for immunization: Secondary | ICD-10-CM | POA: Diagnosis not present

## 2016-02-22 DIAGNOSIS — Z3009 Encounter for other general counseling and advice on contraception: Secondary | ICD-10-CM | POA: Diagnosis not present

## 2016-02-22 NOTE — Progress Notes (Signed)
By signing my name below, I, Mesha Guinyard, attest that this documentation has been prepared under the direction and in the presence of Delman Cheadle, MD.  Electronically Signed: Verlee Monte, Medical Scribe. 02/22/2016. 4:54 PM.  Subjective:    Patient ID: Dana Perry, female    DOB: [birth year], 25 y.o.   MRN: [id number]  HPI Chief Complaint  Patient presents with  . bloodwork    would like STD testing     HPI Comments: Dana Perry is a 25 y.o. female who presents to the [facility] for complete STD testing. Pt isn't sure if she was vaccinated for Hep B. Pt would like to get vaccinated for Hep B. Pt deferred the HPV vaccine because she didn't like how the first one made her feel. Pt would deferred testing for herpes since she would know if she would have it.  Pt is pregnant but plans on having abortion in the next few days. Pt doesn't want to risk the child while being on 40 mg of Lexapro.  Pt is intolerant of the hormone preperation of birthcontrol. She's ben using the natural rhythm method and the spermicidal sponge. She does have a GYN that she follows regularly. She's interested in the diaphram or the cooper IUD, though she's concerned about the insertion and a foreign body.  Patient Active Problem List   Diagnosis Date Noted  . Generalized anxiety disorder 03/10/2015  . Depression 03/10/2015  . Yeast infection   . Mitral valve prolapse    Past Medical History  Diagnosis Date  . Yeast infection   . Mitral valve prolapse   . Anxiety   . Allergy   . Depression    Past Surgical History  Procedure Laterality Date  . Tonsillectomy    . Appendectomy     No Known Allergies Prior to Admission medications   Medication Sig Start Date End Date Taking? Authorizing Provider  ALPRAZolam Duanne Moron) 0.5 MG tablet When necessary panic attack 03/10/15  Yes Leandrew Koyanagi, MD  escitalopram (LEXAPRO) 20 MG tablet Take 40 mg by mouth every evening.   Yes  Historical Provider, MD  loratadine (CLARITIN) 10 MG tablet Take 10 mg by mouth daily.   Yes Historical Provider, MD  triamcinolone (NASACORT AQ) 55 MCG/ACT AERO nasal inhaler Place 2 sprays into the nose daily. 07/09/15  Yes Roselee Culver, MD   Social History   Social History  . Marital Status: Single    Spouse Name: N/A  . Number of Children: N/A  . Years of Education: N/A   Occupational History  . Not on file.   Social History Main Topics  . Smoking status: Former Research scientist (life sciences)  . Smokeless tobacco: Never Used  . Alcohol Use: 1.8 oz/week    3 Standard drinks or equivalent per week  . Drug Use: Yes    Special: Marijuana     Comment: has smoked weed in the past   . Sexual Activity:    Partners: Male    Birth Control/ Protection: Condom   Other Topics Concern  . Not on file   Social History Narrative   Depression screen [facility] 2/9 02/17/2016 12/24/2015 08/03/2015 07/09/2015 06/02/2015  Decreased Interest 0 0 0 0 0  Down, Depressed, Hopeless 0 0 0 0 0  PHQ - 2 Score 0 0 0 0 0   Review of Systems  Constitutional: Negative for fever, chills, activity change, appetite change and fatigue.  HENT: Negative for rhinorrhea, sneezing and sore throat.  Respiratory: Negative for cough.   Gastrointestinal: Negative for nausea, vomiting, diarrhea and constipation.  Genitourinary: Negative for dysuria, urgency, frequency, flank pain, vaginal bleeding, vaginal discharge, enuresis, genital sores, vaginal pain, menstrual problem, pelvic pain and dyspareunia.  Allergic/Immunologic: Negative for immunocompromised state.  Hematological: Negative for adenopathy.  Psychiatric/Behavioral: Positive for behavioral problems and dysphoric mood.   Objective:  BP 116/70 mmHg  Pulse 90  Temp(Src) 98.5 F (36.9 C) (Oral)  Resp 17  Ht 5\' 7"  (1.702 m)  Wt 173 lb 6.4 oz (78.654 kg)  BMI 27.15 kg/m2  SpO2 98%  LMP 01/17/2016  Physical Exam  Constitutional: She appears well-developed and well-nourished.  No distress.  HENT:  Head: Normocephalic and atraumatic.  Eyes: Conjunctivae are normal.  Neck: Neck supple.  Cardiovascular: Normal rate.   Pulmonary/Chest: Effort normal.  Neurological: She is alert.  Skin: Skin is warm and dry.  Psychiatric: She has a normal mood and affect. Her behavior is normal.  Nursing note and vitals reviewed.   Assessment & Plan:   1. Screening for STD (sexually transmitted disease)   Here for her routine 6 mo screening. In a committed monogamous relationship for 9 mos now.  Pt is having an elective abortion due to accidental pregnancy which has been exposed to very high doses of psych meds and alcohol.  Pt has been practicing natural family planning as has had poor mood responses to hormonal contraceptives. Strongly encouraged pt to consider copper IUD and that her gyn would have the easiest time placing this very soon after the EAB since her cervix will be softer and more open - encouraged pt to sched an appt with her gynecologist to discuss this or other barrier method such as a diaphragm - pt agrees to do so.   If pt is neg with hep B surg ab as she has been prior, advised pt to RTC to start hep B series which she agrees to do.  Will place order for immunization so can be done at nurse only visit if ab neg.  Pt refuses to complete hpv series - only had #1 - didn't like the way they made her feel. . . .  Pt declines wet prep -as no vag d/c, pruritis, pain, or odor.   Orders Placed This Encounter  Procedures  . GC/Chlamydia Probe Amp    Order Specific Question:  Source    Answer:  urine  . RPR  . Hepatitis B surface antibody  . Hepatitis C Antibody  . HIV antibody      I personally performed the services described in this documentation, which was scribed in my presence. The recorded information has been reviewed and considered, and addended by me as needed.   Delman Cheadle, M.D.  [facility] [address] [place], [place] [address] [phone] phone [phone] fax  02/22/2016 6:44 PM

## 2016-02-22 NOTE — Patient Instructions (Addendum)
Contraception Choices Contraception (birth control) is the use of any methods or devices to prevent pregnancy. Below are some methods to help avoid pregnancy. HORMONAL METHODS   Contraceptive implant. This is a thin, plastic tube containing progesterone hormone. It does not contain estrogen hormone. Your health care provider inserts the tube in the inner part of the upper arm. The tube can remain in place for up to 3 years. After 3 years, the implant must be removed. The implant prevents the ovaries from releasing an egg (ovulation), thickens the cervical mucus to prevent sperm from entering the uterus, and thins the lining of the inside of the uterus.  Progesterone-only injections. These injections are given every 3 months by your health care provider to prevent pregnancy. This synthetic progesterone hormone stops the ovaries from releasing eggs. It also thickens cervical mucus and changes the uterine lining. This makes it harder for sperm to survive in the uterus.  Birth control pills. These pills contain estrogen and progesterone hormone. They work by preventing the ovaries from releasing eggs (ovulation). They also cause the cervical mucus to thicken, preventing the sperm from entering the uterus. Birth control pills are prescribed by a health care provider.Birth control pills can also be used to treat heavy periods.  Minipill. This type of birth control pill contains only the progesterone hormone. They are taken every day of each month and must be prescribed by your health care provider.  Birth control patch. The patch contains hormones similar to those in birth control pills. It must be changed once a week and is prescribed by a health care provider.  Vaginal ring. The ring contains hormones similar to those in birth control pills. It is left in the vagina for 3 weeks, removed for 1 week, and then a new one is put back in place. The patient must be comfortable inserting and removing the ring  from the vagina.A health care provider's prescription is necessary.  Emergency contraception. Emergency contraceptives prevent pregnancy after unprotected sexual intercourse. This pill can be taken right after sex or up to 5 days after unprotected sex. It is most effective the sooner you take the pills after having sexual intercourse. Most emergency contraceptive pills are available without a prescription. Check with your pharmacist. Do not use emergency contraception as your only form of birth control. BARRIER METHODS   Female condom. This is a thin sheath (latex or rubber) that is worn over the penis during sexual intercourse. It can be used with spermicide to increase effectiveness.  Female condom. This is a soft, loose-fitting sheath that is put into the vagina before sexual intercourse.  Diaphragm. This is a soft, latex, dome-shaped barrier that must be fitted by a health care provider. It is inserted into the vagina, along with a spermicidal jelly. It is inserted before intercourse. The diaphragm should be left in the vagina for 6 to 8 hours after intercourse.  Cervical cap. This is a round, soft, latex or plastic cup that fits over the cervix and must be fitted by a health care provider. The cap can be left in place for up to 48 hours after intercourse.  Sponge. This is a soft, circular piece of polyurethane foam. The sponge has spermicide in it. It is inserted into the vagina after wetting it and before sexual intercourse.  Spermicides. These are chemicals that kill or block sperm from entering the cervix and uterus. They come in the form of creams, jellies, suppositories, foam, or tablets. They do not require a   prescription. They are inserted into the vagina with an applicator before having sexual intercourse. The process must be repeated every time you have sexual intercourse. INTRAUTERINE CONTRACEPTION  Intrauterine device (IUD). This is a T-shaped device that is put in a woman's uterus  during a menstrual period to prevent pregnancy. There are 2 types:  Copper IUD. This type of IUD is wrapped in copper wire and is placed inside the uterus. Copper makes the uterus and fallopian tubes produce a fluid that kills sperm. It can stay in place for 10 years.  Hormone IUD. This type of IUD contains the hormone progestin (synthetic progesterone). The hormone thickens the cervical mucus and prevents sperm from entering the uterus, and it also thins the uterine lining to prevent implantation of a fertilized egg. The hormone can weaken or kill the sperm that get into the uterus. It can stay in place for 3-5 years, depending on which type of IUD is used. PERMANENT METHODS OF CONTRACEPTION  Female tubal ligation. This is when the woman's fallopian tubes are surgically sealed, tied, or blocked to prevent the egg from traveling to the uterus.  Hysteroscopic sterilization. This involves placing a small coil or insert into each fallopian tube. Your doctor uses a technique called hysteroscopy to do the procedure. The device causes scar tissue to form. This results in permanent blockage of the fallopian tubes, so the sperm cannot fertilize the egg. It takes about 3 months after the procedure for the tubes to become blocked. You must use another form of birth control for these 3 months.  Female sterilization. This is when the female has the tubes that carry sperm tied off (vasectomy).This blocks sperm from entering the vagina during sexual intercourse. After the procedure, the man can still ejaculate fluid (semen). NATURAL PLANNING METHODS  Natural family planning. This is not having sexual intercourse or using a barrier method (condom, diaphragm, cervical cap) on days the woman could become pregnant.  Calendar method. This is keeping track of the length of each menstrual cycle and identifying when you are fertile.  Ovulation method. This is avoiding sexual intercourse during ovulation.  Symptothermal  method. This is avoiding sexual intercourse during ovulation, using a thermometer and ovulation symptoms.  Post-ovulation method. This is timing sexual intercourse after you have ovulated. Regardless of which type or method of contraception you choose, it is important that you use condoms to protect against the transmission of sexually transmitted infections (STIs). Talk with your health care provider about which form of contraception is most appropriate for you.   This information is not intended to replace advice given to you by your health care provider. Make sure you discuss any questions you have with your health care provider.   Document Released: 08/22/2005 Document Revised: 08/27/2013 Document Reviewed: 02/14/2013 Elsevier Interactive Patient Education [employer].    IF you received an x-ray today, you will receive an invoice from [facility]. Please contact [facility] at [phone] with questions or concerns regarding your invoice.   IF you received labwork today, you will receive an invoice from [employer]. Please contact [facility] at [phone] with questions or concerns regarding your invoice.   Our billing staff will not be able to assist you with questions regarding bills from these companies.  You will be contacted with the lab results as soon as they are available. The fastest way to get your results is to activate your My Chart account. Instructions are located on the last page of this  paperwork. If you have not heard from [place] regarding the results in 2 weeks, please contact this office.     Sexually Transmitted Disease A sexually transmitted disease (STD) is a disease or infection that may be passed (transmitted) from person to person, usually during sexual activity. This may happen by way of saliva, semen, blood, vaginal mucus, or urine. Common STDs include:  Gonorrhea.  Chlamydia.  Syphilis.  HIV and  AIDS.  Genital herpes.  Hepatitis B and C.  Trichomonas.  Human papillomavirus (HPV).  Pubic lice.  Scabies.  Mites.  Bacterial vaginosis. WHAT ARE CAUSES OF STDs? An STD may be caused by bacteria, a virus, or parasites. STDs are often transmitted during sexual activity if one person is infected. However, they may also be transmitted through nonsexual means. STDs may be transmitted after:   Sexual intercourse with an infected person.  Sharing sex toys with an infected person.  Sharing needles with an infected person or using unclean piercing or tattoo needles.  Having intimate contact with the genitals, mouth, or rectal areas of an infected person.  Exposure to infected fluids during birth. WHAT ARE THE SIGNS AND SYMPTOMS OF STDs? Different STDs have different symptoms. Some people may not have any symptoms. If symptoms are present, they may include:  Painful or bloody urination.  Pain in the pelvis, abdomen, vagina, anus, throat, or eyes.  A skin rash, itching, or irritation.  Growths, ulcerations, blisters, or sores in the genital and anal areas.  Abnormal vaginal discharge with or without bad odor.  Penile discharge in men.  Fever.  Pain or bleeding during sexual intercourse.  Swollen glands in the groin area.  Yellow skin and eyes (jaundice). This is seen with hepatitis.  Swollen testicles.  Infertility.  Sores and blisters in the mouth. HOW ARE STDs DIAGNOSED? To make a diagnosis, your health care provider may:  Take a medical history.  Perform a physical exam.  Take a sample of any discharge to examine.  Swab the throat, cervix, opening to the penis, rectum, or vagina for testing.  Test a sample of your first morning urine.  Perform blood tests.  Perform a Pap test, if this applies.  Perform a colposcopy.  Perform a laparoscopy. HOW ARE STDs TREATED? Treatment depends on the STD. Some STDs may be treated but not cured.  Chlamydia,  gonorrhea, trichomonas, and syphilis can be cured with antibiotic medicine.  Genital herpes, hepatitis, and HIV can be treated, but not cured, with prescribed medicines. The medicines lessen symptoms.  Genital warts from HPV can be treated with medicine or by freezing, burning (electrocautery), or surgery. Warts may come back.  HPV cannot be cured with medicine or surgery. However, abnormal areas may be removed from the cervix, vagina, or vulva.  If your diagnosis is confirmed, your recent sexual partners need treatment. This is true even if they are symptom-free or have a negative culture or evaluation. They should not have sex until their health care providers say it is okay.  Your health care provider may test you for infection again 3 months after treatment. HOW CAN I REDUCE MY RISK OF GETTING AN STD? Take these steps to reduce your risk of getting an STD:  Use latex condoms, dental dams, and water-soluble lubricants during sexual activity. Do not use petroleum jelly or oils.  Avoid having multiple sex partners.  Do not have sex with someone who has other sex partners  Do not have sex with anyone you do not know or  who is at high risk for an STD.  Avoid risky sex practices that can break your skin.  Do not have sex if you have open sores on your mouth or skin.  Avoid drinking too much alcohol or taking illegal drugs. Alcohol and drugs can affect your judgment and put you in a vulnerable position.  Avoid engaging in oral and anal sex acts.  Get vaccinated for HPV and hepatitis. If you have not received these vaccines in the past, talk to your health care provider about whether one or both might be right for you.  If you are at risk of being infected with HIV, it is recommended that you take a prescription medicine daily to prevent HIV infection. This is called pre-exposure prophylaxis (PrEP). You are considered at risk if:  You are a man who has sex with other men (MSM).  You  are a heterosexual man or woman and are sexually active with more than one partner.  You take drugs by injection.  You are sexually active with a partner who has HIV.  Talk with your health care provider about whether you are at high risk of being infected with HIV. If you choose to begin PrEP, you should first be tested for HIV. You should then be tested every 3 months for as long as you are taking PrEP. WHAT SHOULD I DO IF I THINK I HAVE AN STD?  See your health care provider.  Tell your sexual partner(s). They should be tested and treated for any STDs.  Do not have sex until your health care provider says it is okay. WHEN SHOULD I GET IMMEDIATE MEDICAL CARE? Contact your health care provider right away if:   You have severe abdominal pain.  You are a man and notice swelling or pain in your testicles.  You are a woman and notice swelling or pain in your vagina.   This information is not intended to replace advice given to you by your health care provider. Make sure you discuss any questions you have with your health care provider.   Document Released: 11/12/2002 Document Revised: 09/12/2014 Document Reviewed: 03/12/2013 Elsevier Interactive Patient Education [employer].

## 2016-02-23 LAB — GC/CHLAMYDIA PROBE AMP
CT PROBE, AMP APTIMA: NOT DETECTED
GC Probe RNA: NOT DETECTED

## 2016-02-23 LAB — HIV ANTIBODY (ROUTINE TESTING W REFLEX): HIV 1&2 Ab, 4th Generation: NONREACTIVE

## 2016-02-23 LAB — HEPATITIS C ANTIBODY: HCV Ab: NEGATIVE

## 2016-02-23 LAB — HEPATITIS B SURFACE ANTIBODY, QUANTITATIVE: HEPATITIS B-POST: 0 m[IU]/mL

## 2016-02-24 LAB — RPR

## 2016-02-26 ENCOUNTER — Encounter: Payer: Self-pay | Admitting: Family Medicine

## 2016-02-26 NOTE — Addendum Note (Signed)
Addended by: Delman Cheadle on: 02/26/2016 01:46 PM   Modules accepted: Orders

## 2016-05-04 DIAGNOSIS — J069 Acute upper respiratory infection, unspecified: Secondary | ICD-10-CM | POA: Diagnosis not present

## 2016-07-04 DIAGNOSIS — F3342 Major depressive disorder, recurrent, in full remission: Secondary | ICD-10-CM | POA: Diagnosis not present

## 2016-11-11 ENCOUNTER — Encounter (HOSPITAL_COMMUNITY): Payer: Self-pay | Admitting: Emergency Medicine

## 2016-11-11 DIAGNOSIS — R197 Diarrhea, unspecified: Secondary | ICD-10-CM | POA: Insufficient documentation

## 2016-11-11 DIAGNOSIS — R51 Headache: Secondary | ICD-10-CM | POA: Insufficient documentation

## 2016-11-11 DIAGNOSIS — Z87891 Personal history of nicotine dependence: Secondary | ICD-10-CM | POA: Insufficient documentation

## 2016-11-11 DIAGNOSIS — R112 Nausea with vomiting, unspecified: Secondary | ICD-10-CM | POA: Diagnosis not present

## 2016-11-11 LAB — CBC
HEMATOCRIT: 39.2 % (ref 36.0–46.0)
HEMOGLOBIN: 13.9 g/dL (ref 12.0–15.0)
MCH: 33.5 pg (ref 26.0–34.0)
MCHC: 35.5 g/dL (ref 30.0–36.0)
MCV: 94.5 fL (ref 78.0–100.0)
Platelets: 219 10*3/uL (ref 150–400)
RBC: 4.15 MIL/uL (ref 3.87–5.11)
RDW: 12 % (ref 11.5–15.5)
WBC: 6.6 10*3/uL (ref 4.0–10.5)

## 2016-11-11 LAB — I-STAT BETA HCG BLOOD, ED (MC, WL, AP ONLY): I-stat hCG, quantitative: <5 m[IU]/mL (ref <5)

## 2016-11-11 MED ORDER — ONDANSETRON 4 MG PO TBDP
4.0000 mg | ORAL_TABLET | Freq: Once | ORAL | Status: AC | PRN
  Administered 2016-11-11: 4 mg via ORAL

## 2016-11-11 MED ORDER — ONDANSETRON 4 MG PO TBDP
ORAL_TABLET | ORAL | Status: AC
  Filled 2016-11-11: qty 1

## 2016-11-11 NOTE — ED Triage Notes (Addendum)
C/o headache since 4pm with nausea and vomiting since 5pm.  Pt reports "soft" stools the past 2-3 days.  States she vomited x 5 today.  Last used marijuana "candy" 5 hours ago.  States she used marijuana after headache and vomiting started.

## 2016-11-12 ENCOUNTER — Emergency Department (HOSPITAL_COMMUNITY)
Admission: EM | Admit: 2016-11-12 | Discharge: 2016-11-12 | Disposition: A | Discharge status: Home / Self Care | Payer: BLUE CROSS/BLUE SHIELD | Attending: Emergency Medicine | Admitting: Emergency Medicine

## 2016-11-12 DIAGNOSIS — R51 Headache: Secondary | ICD-10-CM

## 2016-11-12 DIAGNOSIS — R519 Headache, unspecified: Secondary | ICD-10-CM

## 2016-11-12 DIAGNOSIS — R112 Nausea with vomiting, unspecified: Secondary | ICD-10-CM

## 2016-11-12 DIAGNOSIS — R197 Diarrhea, unspecified: Secondary | ICD-10-CM

## 2016-11-12 LAB — COMPREHENSIVE METABOLIC PANEL
ALT: 20 U/L (ref 14–54)
AST: 36 U/L (ref 15–41)
Albumin: 4.3 g/dL (ref 3.5–5.0)
Alkaline Phosphatase: 41 U/L (ref 38–126)
Anion gap: 10 (ref 5–15)
BUN: 8 mg/dL (ref 6–20)
CHLORIDE: 103 mmol/L (ref 101–111)
CO2: 24 mmol/L (ref 22–32)
CREATININE: 0.76 mg/dL (ref 0.44–1.00)
Calcium: 9.4 mg/dL (ref 8.9–10.3)
GFR calc non Af Amer: >60 mL/min (ref >60)
Glucose, Bld: 130 mg/dL — ABNORMAL HIGH (ref 65–99)
Potassium: 3.6 mmol/L (ref 3.5–5.1)
SODIUM: 137 mmol/L (ref 135–145)
Total Bilirubin: 0.9 mg/dL (ref 0.3–1.2)
Total Protein: 7.3 g/dL (ref 6.5–8.1)

## 2016-11-12 LAB — LIPASE, BLOOD: LIPASE: 22 U/L (ref 11–51)

## 2016-11-12 MED ORDER — DIPHENOXYLATE-ATROPINE 2.5-0.025 MG PO TABS: 2.0000 | ORAL_TABLET | Freq: Four times a day (QID) | ORAL | 0 refills | Status: DC | PRN

## 2016-11-12 MED ORDER — PROCHLORPERAZINE EDISYLATE 5 MG/ML IJ SOLN
10.0000 mg | Freq: Once | INTRAMUSCULAR | Status: AC
  Administered 2016-11-12: 10 mg via INTRAVENOUS
  Filled 2016-11-12: qty 2

## 2016-11-12 MED ORDER — KETOROLAC TROMETHAMINE 30 MG/ML IJ SOLN
30.0000 mg | Freq: Once | INTRAMUSCULAR | Status: AC
  Administered 2016-11-12: 30 mg via INTRAVENOUS
  Filled 2016-11-12: qty 1

## 2016-11-12 MED ORDER — DIPHENHYDRAMINE HCL 50 MG/ML IJ SOLN
25.0000 mg | Freq: Once | INTRAMUSCULAR | Status: AC
  Administered 2016-11-12: 25 mg via INTRAVENOUS
  Filled 2016-11-12: qty 1

## 2016-11-12 MED ORDER — SODIUM CHLORIDE 0.9 % IV BOLUS (SEPSIS)
1000.0000 mL | Freq: Once | INTRAVENOUS | Status: AC
  Administered 2016-11-12: 1000 mL via INTRAVENOUS

## 2016-11-12 MED ORDER — ONDANSETRON HCL 4 MG PO TABS: 4.0000 mg | ORAL_TABLET | Freq: Four times a day (QID) | ORAL | 0 refills | Status: DC

## 2016-11-12 MED ORDER — NAPROXEN 500 MG PO TABS: 500.0000 mg | ORAL_TABLET | Freq: Two times a day (BID) | ORAL | 0 refills | Status: AC

## 2016-11-12 NOTE — ED Notes (Signed)
Pt c/o a headache  And vomiting.  She thinks she was having a panic attack earlier  Nausea med given in triage  And she reports that she has not vomited since then

## 2016-11-12 NOTE — ED Provider Notes (Signed)
[facility] DEPT Provider Note   CSN: [id number] Arrival date & time: 11/11/16  2232  By signing my name below, I, Dora Sims, attest that this documentation has been prepared under the direction and in the presence of physician practitioner, Orpah Greek, MD. Electronically Signed: Dora Sims, Scribe. 11/12/2016. 1:11 AM.  History   Chief Complaint Chief Complaint  Patient presents with  . Headache  . Emesis    The history is provided by the patient. No language interpreter was used.     HPI Comments: Dana Perry is a 26 y.o. female who presents to the Emergency Department complaining of persistent nausea, vomiting, and diarrhea beginning yesterday morning. She also reports a frontal headache beginning around 5 PM yesterday evening with associated photophobia. Pt used marijuana PTA with no improvement of her symptoms; no other treatments or medications tried. She denies abdominal pain or any other associated symptoms.  Past Medical History:  Diagnosis Date  . Allergy   . Anxiety   . Depression   . Mitral valve prolapse   . Yeast infection     Patient Active Problem List   Diagnosis Date Noted  . Generalized anxiety disorder 03/10/2015  . Depression 03/10/2015  . Yeast infection   . Mitral valve prolapse     Past Surgical History:  Procedure Laterality Date  . APPENDECTOMY    . TONSILLECTOMY      OB History    Gravida Para Term Preterm AB Living   0 0 0 0 0 0   SAB TAB Ectopic Multiple Live Births   0 0 0 0         Home Medications    Prior to Admission medications   Medication Sig Start Date End Date Taking? Authorizing Provider  escitalopram (LEXAPRO) 20 MG tablet Take 40 mg by mouth every evening.   Yes Historical Provider, MD  ALPRAZolam Duanne Moron) 0.5 MG tablet When necessary panic attack Patient not taking: Reported on 11/12/2016 03/10/15   Leandrew Koyanagi, MD  diphenoxylate-atropine (LOMOTIL) 2.5-0.025 MG tablet Take 2 tablets by  mouth 4 (four) times daily as needed for diarrhea or loose stools. 11/12/16   Orpah Greek, MD  naproxen (NAPROSYN) 500 MG tablet Take 1 tablet (500 mg total) by mouth 2 (two) times daily. 11/12/16   Orpah Greek, MD  ondansetron (ZOFRAN) 4 MG tablet Take 1 tablet (4 mg total) by mouth every 6 (six) hours. 11/12/16   Orpah Greek, MD  triamcinolone (NASACORT AQ) 55 MCG/ACT AERO nasal inhaler Place 2 sprays into the nose daily. Patient not taking: Reported on 11/12/2016 07/09/15   Roselee Culver, MD    Family History Family History  Problem Relation Age of Onset  . Mitral valve prolapse Maternal Grandmother   . Diabetes Father   . Diabetes Mother     Social History Social History  Substance Use Topics  . Smoking status: Former Research scientist (life sciences)  . Smokeless tobacco: Never Used  . Alcohol use 1.8 oz/week    3 Standard drinks or equivalent per week     Allergies   Patient has no known allergies.   Review of Systems Review of Systems  Eyes: Positive for photophobia.  Gastrointestinal: Positive for diarrhea, nausea and vomiting. Negative for abdominal pain.  Neurological: Positive for headaches.  All other systems reviewed and are negative.   Physical Exam Updated Vital Signs BP 113/78   Pulse 71   Temp 97.6 F (36.4 C)   Resp 18  LMP 10/28/2016   SpO2 99%   Physical Exam  Constitutional: She is oriented to person, place, and time. She appears well-developed and well-nourished. No distress.  HENT:  Head: Normocephalic and atraumatic.  Right Ear: Hearing normal.  Left Ear: Hearing normal.  Nose: Nose normal.  Mouth/Throat: Oropharynx is clear and moist and mucous membranes are normal.  Eyes: Conjunctivae and EOM are normal. Pupils are equal, round, and reactive to light.  Photophobia.  Neck: Normal range of motion. Neck supple.  Cardiovascular: Regular rhythm, S1 normal and S2 normal.  Exam reveals no gallop and no friction rub.   No murmur  heard. Pulmonary/Chest: Effort normal and breath sounds normal. No respiratory distress. She exhibits no tenderness.  Abdominal: Soft. Normal appearance and bowel sounds are normal. There is no hepatosplenomegaly. There is no tenderness. There is no rebound, no guarding, no tenderness at McBurney's point and negative Murphy's sign. No hernia.  Musculoskeletal: Normal range of motion.  Neurological: She is alert and oriented to person, place, and time. She has normal strength. No cranial nerve deficit or sensory deficit. Coordination normal. GCS eye subscore is 4. GCS verbal subscore is 5. GCS motor subscore is 6.  Skin: Skin is warm, dry and intact. No rash noted. No cyanosis.  Psychiatric: She has a normal mood and affect. Her speech is normal and behavior is normal. Thought content normal.  Nursing note and vitals reviewed.   ED Treatments / Results  Labs (all labs ordered are listed, but only abnormal results are displayed) Labs Reviewed  COMPREHENSIVE METABOLIC PANEL - Abnormal; Notable for the following:       Result Value   Glucose, Bld 130 (*)    All other components within normal limits  LIPASE, BLOOD  CBC  URINALYSIS, ROUTINE W REFLEX MICROSCOPIC  I-STAT BETA HCG BLOOD, ED ([facility], [facility], [facility] ONLY)    EKG  EKG Interpretation None       Radiology No results found.  Procedures Procedures (including critical care time)  DIAGNOSTIC STUDIES: Oxygen Saturation is 99% on RA, normal by my interpretation.    COORDINATION OF CARE: 1:18 AM Discussed treatment plan with pt at bedside and pt agreed to plan.  Medications Ordered in ED Medications  ondansetron (ZOFRAN-ODT) disintegrating tablet 4 mg (4 mg Oral Given 11/11/16 2258)  sodium chloride 0.9 % bolus 1,000 mL (1,000 mLs Intravenous New Bag/Given 11/12/16 0207)  ketorolac (TORADOL) 30 MG/ML injection 30 mg (30 mg Intravenous Given 11/12/16 0208)  prochlorperazine (COMPAZINE) injection 10 mg (10 mg Intravenous Given 11/12/16 0208)   diphenhydrAMINE (BENADRYL) injection 25 mg (25 mg Intravenous Given 11/12/16 0208)     Initial Impression / Assessment and Plan / ED Course  I have reviewed the triage vital signs and the nursing notes.  Pertinent labs & imaging results that were available during my care of the patient were reviewed by me and considered in my medical decision making (see chart for details).  2:37 AM: on reevaluation, patient states her headache has improved significantly after fluids and migraine cocktail.  Patient presented with what appeared to be viral syndrome. She initially started with diarrhea and that was followed by nausea and vomiting. Tonight developed a headache. She does not have any meningismus. Patient is afebrile. No concern for acute infectious process. No concern for subarachnoid hemorrhage. Did discuss imaging, as well as risks of radiation exposure. Recommended initiation of treatment with migraine cocktail. As she has had complete resolution of her headache with this treatment, it is not  felt that she requires imaging. Will discharge with continued symptomatically treatment.  Final Clinical Impressions(s) / ED Diagnoses   Final diagnoses:  Nausea vomiting and diarrhea  Bad headache    New Prescriptions New Prescriptions   DIPHENOXYLATE-ATROPINE (LOMOTIL) 2.5-0.025 MG TABLET    Take 2 tablets by mouth 4 (four) times daily as needed for diarrhea or loose stools.   NAPROXEN (NAPROSYN) 500 MG TABLET    Take 1 tablet (500 mg total) by mouth 2 (two) times daily.   ONDANSETRON (ZOFRAN) 4 MG TABLET    Take 1 tablet (4 mg total) by mouth every 6 (six) hours.   I personally performed the services described in this documentation, which was scribed in my presence. The recorded information has been reviewed and is accurate.    Orpah Greek, MD 11/12/16 [phone]

## 2017-03-17 DIAGNOSIS — S42402A Unspecified fracture of lower end of left humerus, initial encounter for closed fracture: Secondary | ICD-10-CM | POA: Diagnosis not present

## 2017-03-17 DIAGNOSIS — Y999 Unspecified external cause status: Secondary | ICD-10-CM | POA: Diagnosis not present

## 2017-03-17 DIAGNOSIS — W19XXXA Unspecified fall, initial encounter: Secondary | ICD-10-CM | POA: Diagnosis not present

## 2017-03-17 DIAGNOSIS — S42392A Other fracture of shaft of left humerus, initial encounter for closed fracture: Secondary | ICD-10-CM | POA: Diagnosis not present

## 2017-03-17 DIAGNOSIS — S42332A Displaced oblique fracture of shaft of humerus, left arm, initial encounter for closed fracture: Secondary | ICD-10-CM | POA: Diagnosis not present

## 2017-03-17 DIAGNOSIS — M79622 Pain in left upper arm: Secondary | ICD-10-CM | POA: Diagnosis not present

## 2017-03-17 DIAGNOSIS — M898X1 Other specified disorders of bone, shoulder: Secondary | ICD-10-CM | POA: Diagnosis not present

## 2017-03-17 DIAGNOSIS — W010XXA Fall on same level from slipping, tripping and stumbling without subsequent striking against object, initial encounter: Secondary | ICD-10-CM | POA: Diagnosis not present

## 2017-03-20 DIAGNOSIS — S42352A Displaced comminuted fracture of shaft of humerus, left arm, initial encounter for closed fracture: Secondary | ICD-10-CM | POA: Diagnosis not present

## 2017-03-24 DIAGNOSIS — S42352A Displaced comminuted fracture of shaft of humerus, left arm, initial encounter for closed fracture: Secondary | ICD-10-CM | POA: Diagnosis not present

## 2017-03-24 DIAGNOSIS — S42352D Displaced comminuted fracture of shaft of humerus, left arm, subsequent encounter for fracture with routine healing: Secondary | ICD-10-CM | POA: Diagnosis not present

## 2017-03-24 DIAGNOSIS — W19XXXA Unspecified fall, initial encounter: Secondary | ICD-10-CM | POA: Diagnosis not present

## 2017-03-24 DIAGNOSIS — S42402A Unspecified fracture of lower end of left humerus, initial encounter for closed fracture: Secondary | ICD-10-CM | POA: Diagnosis not present

## 2017-03-24 DIAGNOSIS — S42332A Displaced oblique fracture of shaft of humerus, left arm, initial encounter for closed fracture: Secondary | ICD-10-CM | POA: Diagnosis not present

## 2017-05-04 DIAGNOSIS — S42392D Other fracture of shaft of left humerus, subsequent encounter for fracture with routine healing: Secondary | ICD-10-CM | POA: Diagnosis not present

## 2017-05-04 DIAGNOSIS — Z4789 Encounter for other orthopedic aftercare: Secondary | ICD-10-CM | POA: Diagnosis not present

## 2017-09-18 DIAGNOSIS — F411 Generalized anxiety disorder: Secondary | ICD-10-CM | POA: Diagnosis not present

## 2017-12-26 DIAGNOSIS — R300 Dysuria: Secondary | ICD-10-CM | POA: Diagnosis not present

## 2017-12-29 ENCOUNTER — Encounter: Payer: Self-pay | Admitting: Internal Medicine

## 2018-01-01 DIAGNOSIS — R110 Nausea: Secondary | ICD-10-CM | POA: Diagnosis not present

## 2018-01-01 DIAGNOSIS — M545 Low back pain: Secondary | ICD-10-CM | POA: Diagnosis not present

## 2018-01-01 DIAGNOSIS — R109 Unspecified abdominal pain: Secondary | ICD-10-CM | POA: Diagnosis not present

## 2018-01-31 DIAGNOSIS — K5900 Constipation, unspecified: Secondary | ICD-10-CM | POA: Diagnosis not present

## 2018-01-31 DIAGNOSIS — M545 Low back pain: Secondary | ICD-10-CM | POA: Diagnosis not present

## 2018-01-31 DIAGNOSIS — K219 Gastro-esophageal reflux disease without esophagitis: Secondary | ICD-10-CM | POA: Diagnosis not present

## 2018-01-31 DIAGNOSIS — R1030 Lower abdominal pain, unspecified: Secondary | ICD-10-CM | POA: Diagnosis not present

## 2018-02-09 ENCOUNTER — Ambulatory Visit (INDEPENDENT_AMBULATORY_CARE_PROVIDER_SITE_OTHER): Payer: BLUE CROSS/BLUE SHIELD | Admitting: Physician Assistant

## 2018-02-09 ENCOUNTER — Other Ambulatory Visit: Payer: Self-pay

## 2018-02-09 ENCOUNTER — Encounter: Payer: Self-pay | Admitting: Physician Assistant

## 2018-02-09 VITALS — BP 102/70 | HR 80 | Temp 98.2°F | Resp 16 | Ht 67.0 in | Wt 165.0 lb

## 2018-02-09 DIAGNOSIS — R1310 Dysphagia, unspecified: Secondary | ICD-10-CM | POA: Diagnosis not present

## 2018-02-09 DIAGNOSIS — R1319 Other dysphagia: Secondary | ICD-10-CM

## 2018-02-09 NOTE — Patient Instructions (Addendum)
  Please continue the Prilosec and make sure you take it 30 minutes in the morning before eating and then always try to eat a little something to help the medication worked best.  Dennis Bast could take Zantac 150 or famotidine 20 mg at night.  You could also consider elevating the head of the bed.  Try to avoid NSAIDs as these tend to worsen gastric problems.   IF you received an x-ray today, you will receive an invoice from [facility]. Please contact [facility] at [phone] with questions or concerns regarding your invoice.   IF you received labwork today, you will receive an invoice from [facility]. Please contact [facility] at [phone] with questions or concerns regarding your invoice.   Our billing staff will not be able to assist you with questions regarding bills from these companies.  You will be contacted with the lab results as soon as they are available. The fastest way to get your results is to activate your My Chart account. Instructions are located on the last page of this paperwork. If you have not heard from [place] regarding the results in 2 weeks, please contact this office.

## 2018-02-09 NOTE — Progress Notes (Signed)
02/09/2018 10:15 AM   DOB: [birth year] / MRN: [id number]  SUBJECTIVE:  Dana Perry is a 27 y.o. female presenting for dysphasia.  This is been going on now for about 2 weeks.  She tells me that the food is just not going down quickly as it has in the past.  She tells me that even water is going slightly.  She never had a scope.  She does have a GI doctor.  She has been taking Prilosec in the morning without respect to food.  She is worried she has cancer.  She does have a history of anxiety  She has No Known Allergies.   She  has a past medical history of Allergy, Anxiety, Arm fracture, Depression, Mitral valve prolapse, and Yeast infection.    She  reports that she has never smoked. She has never used smokeless tobacco. She reports that she drinks about 1.8 oz of alcohol per week. She reports that she has current or past drug history. Drug: Marijuana. She  reports that she currently engages in sexual activity and has had partners who are Female. She reports using the following method of birth control/protection: Condom. The patient  has a past surgical history that includes Tonsillectomy; Appendectomy; and arm surgery.  Her family history includes Diabetes in her father and mother; Mitral valve prolapse in her maternal grandmother.  Review of Systems  Constitutional: Negative for chills and fever.  HENT: Positive for sore throat.   Cardiovascular: Negative for chest pain.  Gastrointestinal: Positive for heartburn. Negative for abdominal pain, blood in stool, diarrhea, melena, nausea and vomiting.    The problem list and medications were reviewed and updated by myself where necessary and exist elsewhere in the encounter.   OBJECTIVE:  BP 102/70   Pulse 80   Temp 98.2 F (36.8 C) (Oral)   Resp 16   Ht 5\' 7"  (1.702 m)   Wt 165 lb (74.8 kg)   SpO2 98%   BMI 25.84 kg/m   Wt Readings from Last 3 Encounters:  02/09/18 165 lb (74.8 kg)  02/22/16 173 lb 6.4 oz (78.7 kg)  12/24/15  172 lb (78 kg)     Physical Exam  Constitutional: She is oriented to person, place, and time. She appears well-nourished. No distress.  Eyes: Pupils are equal, round, and reactive to light. EOM are normal.  Cardiovascular: Normal rate, regular rhythm, S1 normal, S2 normal, normal heart sounds and intact distal pulses. Exam reveals no gallop, no friction rub and no decreased pulses.  No murmur heard. Pulmonary/Chest: Effort normal. No stridor. No respiratory distress. She has no wheezes. She has no rales.  Abdominal: Soft. Normal appearance and bowel sounds are normal. She exhibits no distension and no mass. There is no tenderness. There is no rigidity, no rebound, no guarding and no CVA tenderness.  Musculoskeletal: She exhibits no edema.  Neurological: She is alert and oriented to person, place, and time. No cranial nerve deficit. Gait normal.  Skin: Skin is dry. She is not diaphoretic.  Psychiatric: She has a normal mood and affect.  Vitals reviewed.   No results found for this or any previous visit (from the past 72 hour(s)).  No results found.  ASSESSMENT AND PLAN:  Rei was seen today for dysphagia.  Diagnoses and all orders for this visit:  Esophageal dysphagia Comments: Patient already taking PPI.  Exam reassuring.  ENT exam completely normal.  She needs to see a GI doctor she has 1 with an  appointment next week.    The patient is advised to call or return to clinic if she does not see an improvement in symptoms, or to seek the care of the closest emergency department if she worsens with the above plan.   Philis Fendt, MHS, PA-C [facility] 02/09/2018 10:15 AM

## 2018-02-13 ENCOUNTER — Ambulatory Visit: Payer: BLUE CROSS/BLUE SHIELD | Admitting: Internal Medicine

## 2018-02-16 ENCOUNTER — Other Ambulatory Visit: Payer: Self-pay | Admitting: Physician Assistant

## 2018-02-16 DIAGNOSIS — K5900 Constipation, unspecified: Secondary | ICD-10-CM | POA: Diagnosis not present

## 2018-02-16 DIAGNOSIS — R3129 Other microscopic hematuria: Secondary | ICD-10-CM

## 2018-02-16 DIAGNOSIS — R1030 Lower abdominal pain, unspecified: Secondary | ICD-10-CM | POA: Diagnosis not present

## 2018-02-16 DIAGNOSIS — K219 Gastro-esophageal reflux disease without esophagitis: Secondary | ICD-10-CM | POA: Diagnosis not present

## 2018-02-16 DIAGNOSIS — R1314 Dysphagia, pharyngoesophageal phase: Secondary | ICD-10-CM | POA: Diagnosis not present

## 2018-02-20 ENCOUNTER — Ambulatory Visit
Admission: RE | Admit: 2018-02-20 | Discharge: 2018-02-20 | Disposition: A | Discharge status: Home / Self Care | Payer: BLUE CROSS/BLUE SHIELD | Source: Ambulatory Visit | Attending: Physician Assistant | Admitting: Physician Assistant

## 2018-02-20 DIAGNOSIS — K219 Gastro-esophageal reflux disease without esophagitis: Secondary | ICD-10-CM

## 2018-02-20 DIAGNOSIS — R1314 Dysphagia, pharyngoesophageal phase: Secondary | ICD-10-CM

## 2018-02-20 DIAGNOSIS — R1310 Dysphagia, unspecified: Secondary | ICD-10-CM | POA: Diagnosis not present

## 2018-03-05 ENCOUNTER — Ambulatory Visit
Admission: RE | Admit: 2018-03-05 | Discharge: 2018-03-05 | Disposition: A | Discharge status: Home / Self Care | Payer: BLUE CROSS/BLUE SHIELD | Source: Ambulatory Visit | Attending: Physician Assistant | Admitting: Physician Assistant

## 2018-03-05 DIAGNOSIS — K5900 Constipation, unspecified: Secondary | ICD-10-CM | POA: Diagnosis not present

## 2018-03-05 DIAGNOSIS — R1030 Lower abdominal pain, unspecified: Secondary | ICD-10-CM

## 2018-03-05 DIAGNOSIS — R3129 Other microscopic hematuria: Secondary | ICD-10-CM

## 2018-03-05 MED ORDER — IOPAMIDOL (ISOVUE-300) INJECTION 61%
100.0000 mL | Freq: Once | INTRAVENOUS | Status: AC | PRN
  Administered 2018-03-05: 100 mL via INTRAVENOUS

## 2018-06-30 ENCOUNTER — Emergency Department (HOSPITAL_COMMUNITY): Payer: BLUE CROSS/BLUE SHIELD

## 2018-06-30 ENCOUNTER — Encounter (HOSPITAL_COMMUNITY): Payer: Self-pay | Admitting: Emergency Medicine

## 2018-06-30 ENCOUNTER — Ambulatory Visit (HOSPITAL_COMMUNITY)
Admission: EM | Admit: 2018-06-30 | Discharge: 2018-06-30 | Disposition: A | Discharge status: Home / Self Care | Payer: BLUE CROSS/BLUE SHIELD | Source: Home / Self Care

## 2018-06-30 ENCOUNTER — Emergency Department (HOSPITAL_COMMUNITY)
Admission: EM | Admit: 2018-06-30 | Discharge: 2018-06-30 | Disposition: A | Discharge status: Home / Self Care | Payer: BLUE CROSS/BLUE SHIELD | Attending: Emergency Medicine | Admitting: Emergency Medicine

## 2018-06-30 ENCOUNTER — Other Ambulatory Visit: Payer: Self-pay

## 2018-06-30 DIAGNOSIS — Z79899 Other long term (current) drug therapy: Secondary | ICD-10-CM | POA: Diagnosis not present

## 2018-06-30 DIAGNOSIS — S060X0A Concussion without loss of consciousness, initial encounter: Secondary | ICD-10-CM | POA: Insufficient documentation

## 2018-06-30 DIAGNOSIS — Y990 Civilian activity done for income or pay: Secondary | ICD-10-CM | POA: Insufficient documentation

## 2018-06-30 DIAGNOSIS — W230XXA Caught, crushed, jammed, or pinched between moving objects, initial encounter: Secondary | ICD-10-CM | POA: Insufficient documentation

## 2018-06-30 DIAGNOSIS — Y939 Activity, unspecified: Secondary | ICD-10-CM | POA: Diagnosis not present

## 2018-06-30 DIAGNOSIS — Y929 Unspecified place or not applicable: Secondary | ICD-10-CM | POA: Diagnosis not present

## 2018-06-30 DIAGNOSIS — S0990XA Unspecified injury of head, initial encounter: Secondary | ICD-10-CM

## 2018-06-30 DIAGNOSIS — R51 Headache: Secondary | ICD-10-CM | POA: Diagnosis not present

## 2018-06-30 MED ORDER — MECLIZINE HCL 25 MG PO TABS
25.0000 mg | ORAL_TABLET | Freq: Once | ORAL | Status: AC
  Administered 2018-06-30: 25 mg via ORAL
  Filled 2018-06-30: qty 1

## 2018-06-30 MED ORDER — MECLIZINE HCL 25 MG PO TABS: 25.0000 mg | ORAL_TABLET | Freq: Three times a day (TID) | ORAL | 0 refills | Status: AC | PRN

## 2018-06-30 NOTE — ED Notes (Signed)
Patient transported to CT 

## 2018-06-30 NOTE — Discharge Instructions (Signed)
You were examined today for a head injury and possible concussion. Your head CT showed no evidence of  Injury today.  I do think your symptoms are due to a mild concussion.  You can use meclizine to help with dizziness and Tylenol for any headaches.  Try and give your brain time to rest and recover try and limit your screen time, I would like free to follow-up with the concussion clinic.  Sometimes serious problems can develop after a head injury. Please return to the emergency department if you experience any of the following symptoms: Repeated vomiting Headache that gets worse and does not go away Loss of consciousness or inability to stay awake at times when you normally would be able to Getting more confused, restless or agitated Convulsions or seizures Difficulty walking or feeling off balance Weakness or numbness Vision changes  A concussion is a very mild traumatic brain injury caused by a bump, jolt or blow to the head, most people recover quickly and fully. You can experience a wide variety of symptoms including:   - Confusion      - Difficulty concentrating       - Trouble remembering new info  - Headache      - Dizziness        - Fuzzy or blurry vision  - Fatigue      - Balance problems      - Light sensitivity  - Mood swings     - Changes in sleep or difficulty sleeping   To help these symptoms improve make sure you are getting plenty of rest, avoid screen time, loud music and strenuous mental activities. Avoid any strenuous physical activities, once your symptoms have resolved a slow and gradual return to activity is recommended. It is very important that you avoid situations in which you might sustain a second head injury as this can be very dangerous and life threatening. You cannot be medically cleared to return to normal activities until you have followed up with your primary doctor or a concussion specialist for reevaluation.

## 2018-06-30 NOTE — ED Notes (Signed)
Discharge instructions and prescription discussed with Pt. Pt verbalized understanding. Pt stable and ambulatory.    

## 2018-06-30 NOTE — ED Provider Notes (Signed)
[facility] EMERGENCY DEPARTMENT Provider Note   CSN: [id number] Arrival date & time: 06/30/18  1420     History   Chief Complaint Chief Complaint  Patient presents with  . Head Injury    HPI Dana Perry is a 27 y.o. female.  Dana Perry is a 27 y.o. Female with a history of mitral valve prolapse, allergies, depression and anxiety, who presents to the emergency department for evaluation after a head injury.  Reports that while at work Wednesday night a box fell on her head.  She reports it was not a super heavy box and she did not lose consciousness.  She reports she felt kind of dizzy and stunned afterwards.  Box did not hit anywhere else on her body.  She has not had any neck pain or pain with movement of her neck.  She denies any numbness tingling or weakness anywhere.  She has not had any facial asymmetry or difficulties with speech or swallowing.  She has reported some decreased concentration and intermittent disorientation and she feels like her head feels fuzzy and it is difficult to think.  She denies any pre-or postevent amnesia.  While trying to exercise Thursday and Friday she had some dizziness and nausea which seemed to resolve after she stopped.  She has not taken anything to treat her symptoms prior to arrival.  No history of prior head injuries.     Past Medical History:  Diagnosis Date  . Allergy   . Anxiety   . Arm fracture   . Depression   . Mitral valve prolapse   . Yeast infection     Patient Active Problem List   Diagnosis Date Noted  . Generalized anxiety disorder 03/10/2015  . Depression 03/10/2015  . Yeast infection   . Mitral valve prolapse     Past Surgical History:  Procedure Laterality Date  . APPENDECTOMY    . arm surgery    . TONSILLECTOMY       OB History    Gravida  0   Para  0   Term  0   Preterm  0   AB  0   Living  0     SAB  0   TAB  0   Ectopic  0   Multiple  0   Live Births                 Home Medications    Prior to Admission medications   Medication Sig Start Date End Date Taking? Authorizing Provider  ALPRAZolam Duanne Moron) 0.5 MG tablet When necessary panic attack Patient not taking: Reported on 11/12/2016 03/10/15   Leandrew Koyanagi, MD  diphenoxylate-atropine (LOMOTIL) 2.5-0.025 MG tablet Take 2 tablets by mouth 4 (four) times daily as needed for diarrhea or loose stools. Patient not taking: Reported on 02/09/2018 11/12/16   Orpah Greek, MD  escitalopram (LEXAPRO) 20 MG tablet Take 40 mg by mouth every evening.    [provider]  fluticasone (FLONASE) 50 MCG/ACT nasal spray Place 1 spray into both nostrils daily.    [provider]  meclizine (ANTIVERT) 25 MG tablet Take 1 tablet (25 mg total) by mouth 3 (three) times daily as needed for dizziness. 06/30/18   Jacqlyn Larsen, PA-C  naproxen (NAPROSYN) 500 MG tablet Take 1 tablet (500 mg total) by mouth 2 (two) times daily. 11/12/16   Orpah Greek, MD  omeprazole (PRILOSEC) 40 MG capsule Take 40 mg by  mouth daily.    [provider]  ondansetron (ZOFRAN) 4 MG tablet Take 1 tablet (4 mg total) by mouth every 6 (six) hours. Patient not taking: Reported on 02/09/2018 11/12/16   Orpah Greek, MD  triamcinolone (NASACORT AQ) 55 MCG/ACT AERO nasal inhaler Place 2 sprays into the nose daily. Patient not taking: Reported on 11/12/2016 07/09/15   Roselee Culver, MD    Family History Family History  Problem Relation Age of Onset  . Mitral valve prolapse Maternal Grandmother   . Diabetes Father   . Diabetes Mother     Social History Social History   Tobacco Use  . Smoking status: Never Smoker  . Smokeless tobacco: Never Used  Substance Use Topics  . Alcohol use: Yes    Alcohol/week: 3.0 standard drinks    Types: 3 Standard drinks or equivalent per week  . Drug use: Yes    Types: Marijuana    Comment: has smoked weed in the past      Allergies    Patient has no known allergies.   Review of Systems Review of Systems  Constitutional: Negative for chills and fever.  HENT: Negative for congestion, rhinorrhea and sore throat.   Eyes: Negative for pain and visual disturbance.  Respiratory: Negative for cough and shortness of breath.   Cardiovascular: Negative for chest pain.  Gastrointestinal: Positive for nausea. Negative for abdominal distention, abdominal pain, constipation and vomiting.  Genitourinary: Negative for dysuria and frequency.  Musculoskeletal: Negative for arthralgias, back pain and neck pain.  Skin: Negative for color change and rash.  Neurological: Positive for dizziness. Negative for seizures, syncope, facial asymmetry, speech difficulty, weakness, light-headedness, numbness and headaches.  Psychiatric/Behavioral: Positive for decreased concentration. Negative for confusion.     Physical Exam Updated Vital Signs BP 125/73 (BP Location: Right Arm)   Pulse 81   Temp 98.2 F (36.8 C)   Resp 16   Ht 5' 6.5" (1.689 m)   Wt 72.6 kg   LMP 06/28/2018   SpO2 100%   BMI 25.44 kg/m   Physical Exam  Constitutional: She is oriented to person, place, and time. She appears well-developed and well-nourished. No distress.  HENT:  Head: Normocephalic and atraumatic.  Mouth/Throat: Oropharynx is clear and moist.  Eyes: Pupils are equal, round, and reactive to light. EOM are normal. Right eye exhibits no discharge. Left eye exhibits no discharge.  Neck: Normal range of motion. Neck supple.  C-spine NTTP and ROM intact  Cardiovascular: Normal rate, regular rhythm, normal heart sounds and intact distal pulses.  Pulses:      Radial pulses are 2+ on the right side, and 2+ on the left side.       Dorsalis pedis pulses are 2+ on the right side, and 2+ on the left side.       Posterior tibial pulses are 2+ on the right side, and 2+ on the left side.  Pulmonary/Chest: Effort normal. No respiratory distress.  Abdominal: Soft.  Bowel sounds are normal. She exhibits no distension and no mass. There is no tenderness. There is no guarding.  Musculoskeletal: She exhibits no deformity.  Neurological: She is alert and oriented to person, place, and time. Coordination normal.  Speech is clear, able to follow commands CN III-XII intact Normal strength in upper and lower extremities bilaterally including dorsiflexion and plantar flexion, strong and equal grip strength Sensation normal to light and sharp touch Moves extremities without ataxia, coordination intact Normal finger to nose and rapid alternating  movements No pronator drift  Skin: Skin is warm and dry. Capillary refill takes less than 2 seconds. She is not diaphoretic.  Psychiatric: She has a normal mood and affect. Her behavior is normal.  Nursing note and vitals reviewed.    ED Treatments / Results  Labs (all labs ordered are listed, but only abnormal results are displayed) Labs Reviewed - No data to display  EKG None  Radiology Ct Head Wo Contrast  Result Date: 06/30/2018 CLINICAL DATA:  Box fell on head. Small lump on the top of her head and feeling dizzy with headache. EXAM: CT HEAD WITHOUT CONTRAST TECHNIQUE: Contiguous axial images were obtained from the base of the skull through the vertex without intravenous contrast. COMPARISON:  None. FINDINGS: Brain: The ventricles are normal in size and configuration. No extra-axial fluid collections are identified. The gray-white differentiation is maintained. No CT findings for acute hemispheric infarction or intracranial hemorrhage. No mass lesions. The brainstem and cerebellum are normal. Vascular: No hyperdense vessels or obvious aneurysm. Skull: No acute skull fracture.  No bone lesion. Sinuses/Orbits: The paranasal sinuses and mastoid air cells are clear. The globes are intact. Other: No scalp lesions, laceration or hematoma. IMPRESSION: Normal head CT. Electronically Signed   By: Marijo Sanes M.D.   On:  06/30/2018 16:52    Procedures Procedures (including critical care time)  Medications Ordered in ED Medications  meclizine (ANTIVERT) tablet 25 mg (25 mg Oral Given 06/30/18 1640)     Initial Impression / Assessment and Plan / ED Course  I have reviewed the triage vital signs and the nursing notes.  Pertinent labs & imaging results that were available during my care of the patient were reviewed by me and considered in my medical decision making (see chart for details).  Presents to the emergency department for evaluation after head injury which occurred 3 days ago.  She reports she did not have any loss of consciousness but since then has had intermittent dizziness, nausea and some decreased concentration and confusion.  No significant worsening in her symptoms but they continue to persist she has not had any vomiting.  No focal neurologic deficits and normal vitals.  I suspect concussion but will get CT of the head to rule out any other intracranial abnormality.  Meclizine provided for dizziness.  She does not have any ataxia or cerebellar findings on neurologic exam.  CT head shows no acute intracranial abnormalities.  Think patient's symptoms are likely due to concussion I have provided concussion precautions and head injury precautions will have patient follow-up with the concussion clinic for reevaluation.  Encouraged brain rest.  Patient expresses understanding and is in agreement with this plan.  Stable for discharge home.  Final Clinical Impressions(s) / ED Diagnoses   Final diagnoses:  Injury of head, initial encounter  Concussion without loss of consciousness, initial encounter    ED Discharge Orders         Ordered    meclizine (ANTIVERT) 25 MG tablet  3 times daily PRN     06/30/18 1633           Jacqlyn Larsen, PA-C 06/30/18 1658    Drenda Freeze, MD 06/30/18 2330

## 2018-06-30 NOTE — ED Triage Notes (Signed)
Pt. Stated, I was at working at [employer] and a box fell on the back of my head onto my shoulder. I have felt dizzy off and on. When I went to exercise I started feeling nausea and not right.

## 2018-06-30 NOTE — ED Triage Notes (Signed)
Pt. Alert and oriented x 4 . Pt. Stated, It seems hard to focus.

## 2018-09-19 DIAGNOSIS — Z113 Encounter for screening for infections with a predominantly sexual mode of transmission: Secondary | ICD-10-CM | POA: Diagnosis not present

## 2018-09-19 DIAGNOSIS — R3915 Urgency of urination: Secondary | ICD-10-CM | POA: Diagnosis not present

## 2018-09-19 DIAGNOSIS — R3121 Asymptomatic microscopic hematuria: Secondary | ICD-10-CM | POA: Diagnosis not present

## 2018-09-19 DIAGNOSIS — Z01419 Encounter for gynecological examination (general) (routine) without abnormal findings: Secondary | ICD-10-CM | POA: Diagnosis not present

## 2018-09-19 DIAGNOSIS — Z124 Encounter for screening for malignant neoplasm of cervix: Secondary | ICD-10-CM | POA: Diagnosis not present

## 2018-09-19 DIAGNOSIS — Z3040 Encounter for surveillance of contraceptives, unspecified: Secondary | ICD-10-CM | POA: Diagnosis not present

## 2018-11-20 DIAGNOSIS — R05 Cough: Secondary | ICD-10-CM | POA: Diagnosis not present

## 2018-11-20 DIAGNOSIS — J209 Acute bronchitis, unspecified: Secondary | ICD-10-CM | POA: Diagnosis not present

## 2018-11-20 DIAGNOSIS — R52 Pain, unspecified: Secondary | ICD-10-CM | POA: Diagnosis not present

## 2019-02-19 DIAGNOSIS — Z20828 Contact with and (suspected) exposure to other viral communicable diseases: Secondary | ICD-10-CM | POA: Diagnosis not present

## 2019-03-02 DIAGNOSIS — Z20828 Contact with and (suspected) exposure to other viral communicable diseases: Secondary | ICD-10-CM | POA: Diagnosis not present

## 2019-03-28 DIAGNOSIS — Z20828 Contact with and (suspected) exposure to other viral communicable diseases: Secondary | ICD-10-CM | POA: Diagnosis not present

## 2019-04-12 DIAGNOSIS — Z20828 Contact with and (suspected) exposure to other viral communicable diseases: Secondary | ICD-10-CM | POA: Diagnosis not present

## 2019-05-16 DIAGNOSIS — Z20828 Contact with and (suspected) exposure to other viral communicable diseases: Secondary | ICD-10-CM | POA: Diagnosis not present

## 2019-05-31 DIAGNOSIS — Z20828 Contact with and (suspected) exposure to other viral communicable diseases: Secondary | ICD-10-CM | POA: Diagnosis not present

## 2019-07-08 DIAGNOSIS — Z20828 Contact with and (suspected) exposure to other viral communicable diseases: Secondary | ICD-10-CM | POA: Diagnosis not present

## 2019-07-20 DIAGNOSIS — R0602 Shortness of breath: Secondary | ICD-10-CM | POA: Diagnosis not present

## 2019-07-20 DIAGNOSIS — H7292 Unspecified perforation of tympanic membrane, left ear: Secondary | ICD-10-CM | POA: Diagnosis not present

## 2019-07-20 DIAGNOSIS — R1033 Periumbilical pain: Secondary | ICD-10-CM | POA: Diagnosis not present

## 2019-07-20 DIAGNOSIS — R05 Cough: Secondary | ICD-10-CM | POA: Diagnosis not present

## 2019-07-26 DIAGNOSIS — H7292 Unspecified perforation of tympanic membrane, left ear: Secondary | ICD-10-CM | POA: Diagnosis not present

## 2019-07-26 DIAGNOSIS — H60392 Other infective otitis externa, left ear: Secondary | ICD-10-CM | POA: Diagnosis not present

## 2019-07-26 DIAGNOSIS — H81399 Other peripheral vertigo, unspecified ear: Secondary | ICD-10-CM | POA: Diagnosis not present

## 2019-07-27 DIAGNOSIS — Z20828 Contact with and (suspected) exposure to other viral communicable diseases: Secondary | ICD-10-CM | POA: Diagnosis not present

## 2019-08-12 DIAGNOSIS — Z20828 Contact with and (suspected) exposure to other viral communicable diseases: Secondary | ICD-10-CM | POA: Diagnosis not present

## 2019-08-13 DIAGNOSIS — K219 Gastro-esophageal reflux disease without esophagitis: Secondary | ICD-10-CM | POA: Diagnosis not present

## 2019-08-13 DIAGNOSIS — K5900 Constipation, unspecified: Secondary | ICD-10-CM | POA: Diagnosis not present

## 2019-08-14 DIAGNOSIS — Z20828 Contact with and (suspected) exposure to other viral communicable diseases: Secondary | ICD-10-CM | POA: Diagnosis not present

## 2019-08-26 DIAGNOSIS — Z20828 Contact with and (suspected) exposure to other viral communicable diseases: Secondary | ICD-10-CM | POA: Diagnosis not present

## 2020-05-26 ENCOUNTER — Other Ambulatory Visit: Payer: Self-pay

## 2020-05-27 ENCOUNTER — Emergency Department
Admission: EM | Admit: 2020-05-27 | Discharge: 2020-05-27 | Disposition: A | Discharge status: Home / Self Care | Payer: BLUE CROSS/BLUE SHIELD | Attending: Emergency Medicine | Admitting: Emergency Medicine

## 2020-05-27 ENCOUNTER — Other Ambulatory Visit: Payer: Self-pay

## 2020-05-27 DIAGNOSIS — G43909 Migraine, unspecified, not intractable, without status migrainosus: Secondary | ICD-10-CM | POA: Insufficient documentation

## 2020-05-27 DIAGNOSIS — Z23 Encounter for immunization: Secondary | ICD-10-CM | POA: Diagnosis not present

## 2020-05-27 DIAGNOSIS — F1590 Other stimulant use, unspecified, uncomplicated: Secondary | ICD-10-CM | POA: Insufficient documentation

## 2020-05-27 DIAGNOSIS — G43001 Migraine without aura, not intractable, with status migrainosus: Secondary | ICD-10-CM

## 2020-05-27 LAB — COMPREHENSIVE METABOLIC PANEL
ALT: 12 U/L (ref 0–44)
AST: 14 U/L — ABNORMAL LOW (ref 15–41)
Albumin: 4.2 g/dL (ref 3.5–5.0)
Alkaline Phosphatase: 34 U/L — ABNORMAL LOW (ref 38–126)
Anion gap: 11 (ref 5–15)
BUN: 12 mg/dL (ref 6–20)
CO2: 25 mmol/L (ref 22–32)
Calcium: 8.9 mg/dL (ref 8.9–10.3)
Chloride: 100 mmol/L (ref 98–111)
Creatinine, Ser: 0.77 mg/dL (ref 0.44–1.00)
GFR calc Af Amer: >60 mL/min (ref >60)
GFR calc non Af Amer: >60 mL/min (ref >60)
Glucose, Bld: 100 mg/dL — ABNORMAL HIGH (ref 70–99)
Potassium: 4 mmol/L (ref 3.5–5.1)
Sodium: 136 mmol/L (ref 135–145)
Total Bilirubin: 1.2 mg/dL (ref 0.3–1.2)
Total Protein: 7.1 g/dL (ref 6.5–8.1)

## 2020-05-27 LAB — CBC WITH DIFFERENTIAL/PLATELET
Abs Immature Granulocytes: 0.01 10*3/uL (ref 0.00–0.07)
Basophils Absolute: 0 10*3/uL (ref 0.0–0.1)
Basophils Relative: 1 %
Eosinophils Absolute: 0.1 10*3/uL (ref 0.0–0.5)
Eosinophils Relative: 1 %
HCT: 36.1 % (ref 36.0–46.0)
Hemoglobin: 12.9 g/dL (ref 12.0–15.0)
Immature Granulocytes: 0 %
Lymphocytes Relative: 26 %
Lymphs Abs: 1.4 10*3/uL (ref 0.7–4.0)
MCH: 33.6 pg (ref 26.0–34.0)
MCHC: 35.7 g/dL (ref 30.0–36.0)
MCV: 94 fL (ref 80.0–100.0)
Monocytes Absolute: 0.3 10*3/uL (ref 0.1–1.0)
Monocytes Relative: 5 %
Neutro Abs: 3.7 10*3/uL (ref 1.7–7.7)
Neutrophils Relative %: 67 %
Platelets: 219 10*3/uL (ref 150–400)
RBC: 3.84 MIL/uL — ABNORMAL LOW (ref 3.87–5.11)
RDW: 11 % — ABNORMAL LOW (ref 11.5–15.5)
WBC: 5.5 10*3/uL (ref 4.0–10.5)
nRBC: 0 % (ref 0.0–0.2)

## 2020-05-27 LAB — MAGNESIUM: Magnesium: 2 mg/dL (ref 1.7–2.4)

## 2020-05-27 MED ORDER — TETANUS-DIPHTH-ACELL PERTUSSIS 5-2.5-18.5 LF-MCG/0.5 IM SUSP
0.5000 mL | Freq: Once | INTRAMUSCULAR | Status: AC
  Administered 2020-05-27: 0.5 mL via INTRAMUSCULAR
  Filled 2020-05-27: qty 0.5

## 2020-05-27 MED ORDER — ONDANSETRON 4 MG PO TBDP: 4.0000 mg | ORAL_TABLET | Freq: Three times a day (TID) | ORAL | 0 refills | Status: AC | PRN

## 2020-05-27 MED ORDER — METOCLOPRAMIDE HCL 5 MG/ML IJ SOLN
10.0000 mg | Freq: Once | INTRAMUSCULAR | Status: AC
  Administered 2020-05-27: 10 mg via INTRAVENOUS
  Filled 2020-05-27: qty 2

## 2020-05-27 MED ORDER — DEXAMETHASONE SODIUM PHOSPHATE 10 MG/ML IJ SOLN
4.0000 mg | Freq: Once | INTRAMUSCULAR | Status: AC
  Administered 2020-05-27: 4 mg via INTRAVENOUS
  Filled 2020-05-27: qty 1

## 2020-05-27 MED ORDER — ORPHENADRINE CITRATE 30 MG/ML IJ SOLN
60.0000 mg | Freq: Two times a day (BID) | INTRAMUSCULAR | Status: DC
  Administered 2020-05-27: 60 mg via INTRAVENOUS
  Filled 2020-05-27: qty 2

## 2020-05-27 MED ORDER — CYCLOBENZAPRINE HCL 5 MG PO TABS: 5.0000 mg | ORAL_TABLET | Freq: Three times a day (TID) | ORAL | 0 refills | Status: AC | PRN

## 2020-05-27 NOTE — Discharge Instructions (Signed)
You have been prescribed Zofran and Flexeril for your headaches.  You can take these in addition to Tylenol and Motrin.  Please follow-up with neurology regarding your headache that has become chronic.

## 2020-05-27 NOTE — ED Triage Notes (Addendum)
Pt here for HAS for a couple weeks and muscle spasms. Pt also reports her jaw being stiff and neck pain that radiates to the shoulder on the right side. Pt also reports SOB. Pt NAD in triage.

## 2020-05-27 NOTE — ED Provider Notes (Signed)
[facility] Emergency Department Provider Note  ____________________________________________   First MD Initiated Contact with Patient 05/27/20 1437     (approximate)  I have reviewed the triage vital signs and the nursing notes.   HISTORY  Chief Complaint Migraine   HPI Dana Perry is a 29 y.o. female who presents to the emergency department today for concerns that she may have tetanus.  She reports that around 3 weeks ago she was helping her mother work in a garage, when she fell and skinned her knee in grass.  Since that time, she reports she has had right-sided headache, right-sided jaw stiffness, right-sided neck and shoulder spasm, right lower extremity spasms.  She also reports feeling like her chest is "heavy" for the last several days.        Past Medical History:  Diagnosis Date  . Allergy   . Anxiety   . Arm fracture   . Depression   . Mitral valve prolapse   . Yeast infection     Patient Active Problem List   Diagnosis Date Noted  . Generalized anxiety disorder 03/10/2015  . Depression 03/10/2015  . Yeast infection   . Mitral valve prolapse     Past Surgical History:  Procedure Laterality Date  . APPENDECTOMY    . arm surgery    . TONSILLECTOMY      Prior to Admission medications   Medication Sig Start Date End Date Taking? Authorizing Provider  cyclobenzaprine (FLEXERIL) 5 MG tablet Take 1 tablet (5 mg total) by mouth 3 (three) times daily as needed for up to 5 days for muscle spasms. 05/27/20 06/01/20  Marlana Salvage, PA  escitalopram (LEXAPRO) 20 MG tablet Take 40 mg by mouth every evening.    [provider]  fluticasone (FLONASE) 50 MCG/ACT nasal spray Place 1 spray into both nostrils daily.    [provider]  meclizine (ANTIVERT) 25 MG tablet Take 1 tablet (25 mg total) by mouth 3 (three) times daily as needed for dizziness. 06/30/18   Jacqlyn Larsen, PA-C  naproxen (NAPROSYN) 500 MG tablet Take  1 tablet (500 mg total) by mouth 2 (two) times daily. 11/12/16   Orpah Greek, MD  omeprazole (PRILOSEC) 40 MG capsule Take 40 mg by mouth daily.    [provider]  ondansetron (ZOFRAN ODT) 4 MG disintegrating tablet Take 1 tablet (4 mg total) by mouth every 8 (eight) hours as needed for nausea or vomiting. 05/27/20   Marlana Salvage, PA    Allergies Patient has no known allergies.  Family History  Problem Relation Age of Onset  . Mitral valve prolapse Maternal Grandmother   . Diabetes Father   . Diabetes Mother     Social History Social History   Tobacco Use  . Smoking status: Never Smoker  . Smokeless tobacco: Never Used  Substance Use Topics  . Alcohol use: Yes    Alcohol/week: 3.0 standard drinks    Types: 3 Standard drinks or equivalent per week  . Drug use: Yes    Types: Marijuana    Comment: has smoked weed in the past     Review of Systems Constitutional: No fever/chills Eyes: No visual changes. ENT: No sore throat. Cardiovascular: Denies chest pain. Respiratory: Denies shortness of breath. Gastrointestinal: No abdominal pain.  No nausea, no vomiting.  No diarrhea.  No constipation. Genitourinary: Negative for dysuria. Musculoskeletal: Negative for back pain. + Neck spasm and right shoulder pain Skin: Negative for rash.  Neurological:+ for headaches, negative for focal weakness or numbness.  ____________________________________________   PHYSICAL EXAM:  VITAL SIGNS: ED Triage Vitals [05/27/20 1347]  Enc Vitals Group     BP 122/84     Pulse Rate 88     Resp 18     Temp 98.4 F (36.9 C)     Temp Source Oral     SpO2 99 %     Weight 167 lb (75.8 kg)     Height 5\' 7"  (1.702 m)     Head Circumference      Peak Flow      Pain Score 2     Pain Loc      Pain Edu?      Excl. in [place]?     Constitutional: Alert and oriented. Well appearing and in no acute distress. Eyes: Conjunctivae are normal. PERRL. EOMI. Head: Atraumatic. Nose:  No congestion/rhinnorhea. Mouth/Throat: Mucous membranes are moist.   Neck: No stridor.  No cervical spine tenderness to palpation.  There is tenderness to palpation of the right paraspinal muscle groups into the upper trapezius.  Full range of motion of the cervical spine. Cardiovascular: Normal rate, regular rhythm. Grossly normal heart sounds.  Good peripheral circulation. Respiratory: Normal respiratory effort.  No retractions. Lungs CTAB. Gastrointestinal: Soft and nontender. No distention.  Musculoskeletal: No lower extremity tenderness nor edema.  No joint effusions. Neurologic:  Normal speech and language. No gross focal neurologic deficits are appreciated. No gait instability. Skin:  Skin is warm, dry and intact. No rash noted. Psychiatric: Mood and affect are normal. Speech and behavior are normal.  ____________________________________________   LABS (all labs ordered are listed, but only abnormal results are displayed)  Labs Reviewed  CBC WITH DIFFERENTIAL/PLATELET - Abnormal; Notable for the following components:      Result Value   RBC 3.84 (*)    RDW 11.0 (*)    All other components within normal limits  COMPREHENSIVE METABOLIC PANEL - Abnormal; Notable for the following components:   Glucose, Bld 100 (*)    AST 14 (*)    Alkaline Phosphatase 34 (*)    All other components within normal limits  MAGNESIUM   ____________________________________________   INITIAL IMPRESSION / ASSESSMENT AND PLAN / ED COURSE  As part of my medical decision making, I reviewed the following data within the [facility] notes reviewed and incorporated, Old chart reviewed and Notes from prior ED visits        Dana Perry is a 29 year old female who presents to the emergency department for evaluation of right-sided headache, right-sided jaw stiffness, right-sided neck stiffness and right-sided lower extremity muscle spasms.  The patient believes that she has  tetanus.  Physical examination of the patient is grossly normal except for some tenderness to palpation of the right cervical paraspinal muscle group and right upper trapezius.  She has a normal neuro exam.  When I expressed that this is unlikely to be tetanus given that it has been 3 weeks since she had the superficial cut as well as the fact that her symptoms are only one-sided, she stated "do you think it could be a tumor?"  Discussed that I do not have a specific test for tetanus, but that I would run basic labs to ensure that she does not have any calcium or magnesium dyscrasias that can be seen in the setting of tetanus for reassurance.  I also will treat the patient's headache and muscle spasms with Norflex, Reglan  and Decadron IV.  We will also update the patient's Tdap for reassurance if she were to get any other scrapes or cuts.  Upon review of the patient's chart, she has been evaluated by multiple providers of the last month for similar symptoms with a different HPI documented by each provider (different duration of symptoms, different preceding factors, tick bite?,  Hit on car door?). Further review reveals a negative head CT as well as negative Lyme's work up.    Upon reevaluation of the patient after administration of medications, she states that her headache and symptoms are somewhat improved, though not completely resolved. Differentials considered for the patient, but favored to be unlikely given normal neuro exam include stroke, TIA, subarachnoid hemorrhage. I discussed that I have a low suspicion for a tumor given her symptoms and physical exam, but that ultimately she should follow-up with neurology regarding her headache that has been present for 3 weeks.  If they are suspicious for tumor, they may choose to order further imaging.  In the interim, given a somewhat positive response to the medications administered today, I will prescribe the patient a muscle relaxer and Zofran for her  headaches to be taken on outpatient basis.  The patient is amenable with this plan and will follow up with allergy.     ____________________________________________   FINAL CLINICAL IMPRESSION(S) / ED DIAGNOSES  Final diagnoses:  Migraine without aura and with status migrainosus, not intractable     ED Discharge Orders         Ordered    ondansetron (ZOFRAN ODT) 4 MG disintegrating tablet  Every 8 hours PRN        05/27/20 1643    cyclobenzaprine (FLEXERIL) 5 MG tablet  3 times daily PRN        05/27/20 1643          *Please note:  Dana Perry was evaluated in Emergency Department on 05/27/2020 for the symptoms described in the history of present illness. She was evaluated in the context of the global COVID-19 pandemic, which necessitated consideration that the patient might be at risk for infection with the SARS-CoV-2 virus that causes COVID-19. Institutional protocols and algorithms that pertain to the evaluation of patients at risk for COVID-19 are in a state of rapid change based on information released by regulatory bodies including the CDC and federal and state organizations. These policies and algorithms were followed during the patient's care in the ED.  Some ED evaluations and interventions may be delayed as a result of limited staffing during and the pandemic.*   Note:  This document was prepared using Dragon voice recognition software and may include unintentional dictation errors.    Marlana Salvage, PA 05/27/20 1842    Lavonia Drafts, MD 05/30/20 [phone]

## 2020-05-27 NOTE — ED Notes (Signed)
See triage note  Presents with h/a for about 2 weeks   Also having some stiffness to neck   States she had an abrasion area to knee   Is able to open her mouth  Afebrile
